# Patient Record
Sex: Male | Born: 1955 | Race: White | Hispanic: No | Marital: Married | Smoking: Former smoker
Health system: Southern US, Community
[De-identification: ages and names within clinical notes are randomized; demographics above are authoritative.]

## PROBLEM LIST (undated history)

## (undated) DIAGNOSIS — I10 Essential (primary) hypertension: Secondary | ICD-10-CM

## (undated) DIAGNOSIS — E785 Hyperlipidemia, unspecified: Secondary | ICD-10-CM

## (undated) DIAGNOSIS — I639 Cerebral infarction, unspecified: Secondary | ICD-10-CM

## (undated) DIAGNOSIS — R972 Elevated prostate specific antigen [PSA]: Secondary | ICD-10-CM

## (undated) DIAGNOSIS — Z86010 Personal history of colonic polyps: Secondary | ICD-10-CM

## (undated) DIAGNOSIS — I480 Paroxysmal atrial fibrillation: Secondary | ICD-10-CM

## (undated) DIAGNOSIS — I6529 Occlusion and stenosis of unspecified carotid artery: Secondary | ICD-10-CM

## (undated) DIAGNOSIS — G4733 Obstructive sleep apnea (adult) (pediatric): Secondary | ICD-10-CM

## (undated) HISTORY — DX: Obstructive sleep apnea (adult) (pediatric): G47.33

## (undated) HISTORY — DX: Paroxysmal atrial fibrillation: I48.0

## (undated) HISTORY — DX: Cerebral infarction, unspecified: I63.9

## (undated) HISTORY — DX: Occlusion and stenosis of unspecified carotid artery: I65.29

## (undated) HISTORY — PX: PROSTATE BIOPSY: SHX241

## (undated) HISTORY — DX: Personal history of colonic polyps: Z86.010

## (undated) HISTORY — DX: Hyperlipidemia, unspecified: E78.5

## (undated) HISTORY — PX: ACHILLES TENDON REPAIR: SUR1153

## (undated) HISTORY — DX: Essential (primary) hypertension: I10

## (undated) HISTORY — PX: TONSILLECTOMY: SUR1361

---

## 2003-10-23 HISTORY — PX: COLONOSCOPY: SHX174

## 2004-02-08 ENCOUNTER — Ambulatory Visit (HOSPITAL_COMMUNITY): Admission: RE | Admit: 2004-02-08 | Discharge: 2004-02-08 | Payer: Self-pay | Admitting: Family Medicine

## 2004-04-14 ENCOUNTER — Ambulatory Visit (HOSPITAL_COMMUNITY): Admission: RE | Admit: 2004-04-14 | Discharge: 2004-04-14 | Payer: Self-pay | Admitting: Internal Medicine

## 2006-03-14 ENCOUNTER — Ambulatory Visit (HOSPITAL_COMMUNITY): Admission: RE | Admit: 2006-03-14 | Discharge: 2006-03-14 | Payer: Self-pay | Admitting: Family Medicine

## 2007-05-05 ENCOUNTER — Ambulatory Visit (HOSPITAL_COMMUNITY): Admission: RE | Admit: 2007-05-05 | Discharge: 2007-05-05 | Payer: Self-pay | Admitting: Family Medicine

## 2007-07-11 ENCOUNTER — Ambulatory Visit (HOSPITAL_COMMUNITY): Admission: RE | Admit: 2007-07-11 | Discharge: 2007-07-11 | Payer: Self-pay | Admitting: Family Medicine

## 2009-07-22 DIAGNOSIS — I639 Cerebral infarction, unspecified: Secondary | ICD-10-CM

## 2009-07-22 HISTORY — DX: Cerebral infarction, unspecified: I63.9

## 2009-08-02 ENCOUNTER — Inpatient Hospital Stay (HOSPITAL_COMMUNITY): Admission: EM | Admit: 2009-08-02 | Discharge: 2009-08-05 | Payer: Self-pay | Admitting: Emergency Medicine

## 2009-08-02 ENCOUNTER — Ambulatory Visit: Payer: Self-pay | Admitting: Cardiology

## 2009-08-02 DIAGNOSIS — I639 Cerebral infarction, unspecified: Secondary | ICD-10-CM

## 2009-08-02 HISTORY — DX: Cerebral infarction, unspecified: I63.9

## 2009-08-03 ENCOUNTER — Encounter (INDEPENDENT_AMBULATORY_CARE_PROVIDER_SITE_OTHER): Payer: Self-pay | Admitting: Internal Medicine

## 2009-08-03 DIAGNOSIS — I6529 Occlusion and stenosis of unspecified carotid artery: Secondary | ICD-10-CM

## 2009-08-03 HISTORY — DX: Occlusion and stenosis of unspecified carotid artery: I65.29

## 2009-09-01 DIAGNOSIS — G4733 Obstructive sleep apnea (adult) (pediatric): Secondary | ICD-10-CM

## 2009-09-01 HISTORY — DX: Obstructive sleep apnea (adult) (pediatric): G47.33

## 2011-01-25 LAB — GLUCOSE, CAPILLARY
Glucose-Capillary: 103 mg/dL — ABNORMAL HIGH (ref 70–99)
Glucose-Capillary: 90 mg/dL (ref 70–99)
Glucose-Capillary: 96 mg/dL (ref 70–99)

## 2011-01-25 LAB — PROTIME-INR
INR: 1 (ref 0.00–1.49)
INR: 1.09 (ref 0.00–1.49)
Prothrombin Time: 13.1 seconds (ref 11.6–15.2)
Prothrombin Time: 13.5 seconds (ref 11.6–15.2)
Prothrombin Time: 14 seconds (ref 11.6–15.2)

## 2011-01-25 LAB — CK TOTAL AND CKMB (NOT AT ARMC)
CK, MB: 2 ng/mL (ref 0.3–4.0)
Relative Index: 1.1 (ref 0.0–2.5)
Total CK: 190 U/L (ref 7–232)

## 2011-01-25 LAB — BASIC METABOLIC PANEL
Calcium: 9 mg/dL (ref 8.4–10.5)
Calcium: 9.3 mg/dL (ref 8.4–10.5)
GFR calc Af Amer: >60 mL/min (ref >60)
GFR calc Af Amer: >60 mL/min (ref >60)
GFR calc non Af Amer: >60 mL/min (ref >60)
GFR calc non Af Amer: >60 mL/min (ref >60)
Glucose, Bld: 95 mg/dL (ref 70–99)
Potassium: 3.8 mEq/L (ref 3.5–5.1)
Potassium: 4.3 mEq/L (ref 3.5–5.1)
Sodium: 139 mEq/L (ref 135–145)
Sodium: 140 mEq/L (ref 135–145)

## 2011-01-25 LAB — CBC
HCT: 49.7 % (ref 39.0–52.0)
MCV: 88.7 fL (ref 78.0–100.0)
Platelets: 192 10*3/uL (ref 150–400)
Platelets: 210 10*3/uL (ref 150–400)
RBC: 5.18 MIL/uL (ref 4.22–5.81)
RBC: 5.6 MIL/uL (ref 4.22–5.81)
RDW: 12.7 % (ref 11.5–15.5)
WBC: 7.8 10*3/uL (ref 4.0–10.5)

## 2011-01-25 LAB — LUPUS ANTICOAGULANT PANEL
DRVVT: 41.9 secs — ABNORMAL HIGH (ref 34.7–40.5)
dRVVT Incubated 1:1 Mix: 38 secs (ref 36.1–47.0)

## 2011-01-25 LAB — POCT I-STAT, CHEM 8
Creatinine, Ser: 0.7 mg/dL (ref 0.4–1.5)
HCT: 47 % (ref 39.0–52.0)
TCO2: 21 mmol/L (ref 0–100)

## 2011-01-25 LAB — BETA-2-GLYCOPROTEIN I ABS, IGG/M/A
Beta-2 Glyco I IgG: <3 U/mL (ref <=15)
Beta-2-Glycoprotein I IgA: <3 U/mL (ref <=15)
Beta-2-Glycoprotein I IgM: 4 U/mL (ref <=15)

## 2011-01-25 LAB — DIFFERENTIAL
Basophils Relative: 1 % (ref 0–1)
Eosinophils Absolute: 0.1 10*3/uL (ref 0.0–0.7)
Monocytes Absolute: 0.3 10*3/uL (ref 0.1–1.0)
Monocytes Relative: 8 % (ref 3–12)
Neutrophils Relative %: 61 % (ref 43–77)

## 2011-01-25 LAB — COMPREHENSIVE METABOLIC PANEL
ALT: 26 U/L (ref 0–53)
AST: 26 U/L (ref 0–37)
Albumin: 4.1 g/dL (ref 3.5–5.2)
CO2: 26 mEq/L (ref 19–32)
Calcium: 9.1 mg/dL (ref 8.4–10.5)
Chloride: 108 mEq/L (ref 96–112)
Creatinine, Ser: 0.87 mg/dL (ref 0.4–1.5)
GFR calc Af Amer: >60 mL/min (ref >60)
GFR calc non Af Amer: >60 mL/min (ref >60)
Sodium: 139 mEq/L (ref 135–145)

## 2011-01-25 LAB — TSH: TSH: 1.354 u[IU]/mL (ref 0.350–4.500)

## 2011-01-25 LAB — HEMOGLOBIN A1C: Mean Plasma Glucose: 108 mg/dL

## 2011-01-25 LAB — LIPID PANEL
HDL: 50 mg/dL (ref >39)
Total CHOL/HDL Ratio: 3 RATIO
Triglycerides: 119 mg/dL (ref <150)
VLDL: 24 mg/dL (ref 0–40)

## 2011-01-25 LAB — TROPONIN I: Troponin I: <0.01 ng/mL (ref 0.00–0.06)

## 2011-01-25 LAB — PROTEIN S, TOTAL: Protein S Ag, Total: 95 % (ref 70–140)

## 2011-01-25 LAB — APTT: aPTT: 24 seconds (ref 24–37)

## 2011-01-25 LAB — PROTHROMBIN GENE MUTATION

## 2011-01-25 LAB — CARDIOLIPIN ANTIBODIES, IGG, IGM, IGA: Anticardiolipin IgM: <3 MPL U/mL — ABNORMAL LOW (ref <10)

## 2011-01-25 LAB — SEDIMENTATION RATE: Sed Rate: 2 mm/hr (ref 0–16)

## 2011-01-25 LAB — PROTEIN C, TOTAL: Protein C, Total: 105 % (ref 70–140)

## 2011-01-25 LAB — COMPLEMENT, TOTAL: Compl, Total (CH50): 32 U/mL (ref 31–60)

## 2011-01-25 LAB — C3 COMPLEMENT: C3 Complement: 113 mg/dL (ref 88–201)

## 2011-01-25 LAB — RPR: RPR Ser Ql: NONREACTIVE

## 2011-03-09 NOTE — Consult Note (Signed)
NAME:  Aaron Morales, FREUND NO.:  [id number]   MEDICAL RECORD NO.:  [id number]                  PATIENT TYPE:   LOCATION:                                       FACILITY:   PHYSICIAN:  R. Roetta Sessions, M.D.              DATE OF BIRTH:  Nov 28, 1955   DATE OF CONSULTATION:  03/29/2004  DATE OF DISCHARGE:                                   CONSULTATION   PRIMARY CARE PHYSICIAN:  Kirk Ruths, M.D.   REASON FOR CONSULTATION:  Hemoccult-positive stool.   HISTORY OF PRESENT ILLNESS:  Mr. Aaron Morales is a pleasant 55 year old  gentleman who was sent over by the courtesy of Dr. Yetta Numbers to further  evaluate Hemoccult-positive stool found on returnable cards.  Mr. Caggiano  really has not had any bowel symptoms.  He denies rectal bleeding, melena,  constipation, diarrhea.  No abdominal pain.  No upper GI tract symptoms such  as odynophagia, dysphagia, early satiety, reflux symptoms, nausea, or  vomiting.  He presented for a recent routine physical examination and was  found to be Hemoccult-positive, as described above.  No family history of  colorectal neoplasia.  He has never had his colon imaged previously.  He  takes an aspirin 81 mg daily which he recently started a few weeks ago.   LABORATORY DATA:  Laboratory evaluation at Dr. Edison Simon office included a  CBC and CHEM-20, both of which were completely normal.  Lipid profile also  looked good, with cholesterol 173, HDL 53, LDL 94.  TSH and PSA were normal  at 1.868 and 0.64, respectively.   PAST MEDICAL HISTORY:  Significant for no chronic illnesses.   PAST SURGICAL HISTORY:  1. Tonsillectomy.  2. Left Achilles tendon repair previously.   FAMILY HISTORY:  Mother and father are in good health.  No history of  chronic GI or liver illness.   SOCIAL HISTORY:  The patient has been married for 15 years and has two  daughters.  He is employed with [employer] in [place].  No  tobacco.  He drinks three to four beers weekly.   REVIEW OF SYSTEMS:  As in the history of present illness.  No chest pain,  dyspnea on exertion, no fever or chills.  No change in weight.   PHYSICAL EXAMINATION:  GENERAL:  Examination reveals a pleasant 55 year old  gentleman resting comfortably.  VITAL SIGNS:  Weight 254, height 5 foot 9 inches.  Temperature 98, blood  pressure 120/82, pulse 64.  SKIN:  Warm and dry.  No jaundice.  No continuous stigmata of chronic liver  disease.  HEENT:  Conjunctivae pink.  CHEST:  Lungs are clear to auscultation.  CARDIAC:  Regular rate and rhythm without murmurs, gallops, or rubs.  ABDOMEN:  Nondistended, positive bowel sounds, soft, nontender, without  appreciable mass or organomegaly.  Rectal exam is deferred to the time of  colonoscopy.   IMPRESSION:  Mr. Aaron Morales is a 55 year old gentleman essentially devoid  of any gastrointestinal tract symptoms.  He was found to be Hemoccult-  positive.  He needs to have a colonoscopy.   To this end, I have offered Mr. Busbee a colonoscopy in the very near  future at [facility].  We discussed the potential risks, benefits,  and alternatives.  His questions were answered.  He is agreeable.  I will  make further recommendations once the colonoscopy has been performed.   I would like to thank Dr. Yetta Numbers for referring this nice gentleman to  me today.      ___________________________________________                                            Jonathon Bellows, M.D.   RMR/MEDQ  D:  03/29/2004  T:  03/30/2004  Job:  [id number]   cc:   Kirk Ruths, M.D.  [address]  [place]  [place] [address]  Fax: [phone]

## 2011-03-09 NOTE — Op Note (Signed)
NAME:  Aaron Morales, Aaron Morales NO.:  [id number]   MEDICAL RECORD NO.:  [id number]                   PATIENT TYPE:  AMB   LOCATION:  DAY                                  FACILITY:  [facility]   PHYSICIAN:  R. Roetta Sessions, M.D.              DATE OF BIRTH:  1956-01-24   DATE OF PROCEDURE:  DATE OF DISCHARGE:                                 OPERATIVE REPORT   PROCEDURE:  Diagnostic colonoscopy.   ENDOSCOPIST:  Gerrit Friends. Rourk, M.D.   INDICATIONS FOR PROCEDURE:  The patient is a 55 year old gentleman who was  felt to be Hemoccult positive recently in Dr. Edison Simon office.  He has not  had any hematochezia, melena, abdominal pain, or upper GI tract symptoms.  No family history of colorectal neoplasia.  He does take an aspirin daily.  Colonoscopy is now being down.  This approach has been discussed with the  patient at length. The potential risks, benefits, and alternatives have been  reviewed; questions answered.  He is agreeable.  Please see my dictated H&P.   PROCEDURE NOTE:  O2 saturation, blood pressure, pulse and respirations were  monitored throughout the entire procedure.  Conscious sedation: Versed 6 mg IV, Demerol 100 mg IV in divided doses.   INSTRUMENT:  Olympus video chip system.   FINDINGS:  Digital rectal exam revealed no abnormalities.   ENDOSCOPIC FINDINGS:  The prep was excellent.   RECTUM:  Examination of the rectal mucosa including the retroflex view of  the anal verge revealed no abnormalities.   COLON:  The colonic mucosa was surveyed from the rectosigmoid junction  through the left transverse and right colon to the area of the appendiceal  orifice, ileocecal valve, and cecum.  These structures were well seen and  photographed for the record.   From this level the scope was slowly withdrawn; all previously mentioned  mucosal surfaces were again seen.  The colonic mucosa appeared entirely  normal. The patient tolerated the procedure well and  was reacted in  endoscopy.   IMPRESSION:  1. Normal rectum.  2. Normal colon.   RECOMMENDATIONS:  1. Repeat colonoscopy in 10 years.  2. If [name] were to develop clinical signs and symptoms of GI     bleeding otherwise, then further evaluation would be warranted.      ___________________________________________                                            Jonathon Bellows, M.D.   RMR/MEDQ  D:  04/14/2004  T:  04/14/2004  Job:  [id number]   cc:   Kirk Ruths, M.D.  [address]  [place]  [place] [address]  Fax: [phone]   R. Roetta Sessions, M.D.  [address]  [place]  [place] [address]  Fax:  [phone] 

## 2013-01-15 ENCOUNTER — Encounter: Payer: Self-pay | Admitting: *Deleted

## 2013-05-21 ENCOUNTER — Other Ambulatory Visit: Payer: Self-pay | Admitting: *Deleted

## 2013-07-28 ENCOUNTER — Other Ambulatory Visit: Payer: Self-pay

## 2013-07-28 MED ORDER — PROPAFENONE HCL 150 MG PO TABS: 150.0000 mg | ORAL_TABLET | Freq: Three times a day (TID) | ORAL | Status: DC

## 2013-07-28 NOTE — Telephone Encounter (Signed)
Rx was sent to pharmacy electronically. 

## 2014-12-30 ENCOUNTER — Encounter: Payer: Self-pay | Admitting: Internal Medicine

## 2015-02-18 ENCOUNTER — Encounter: Payer: Self-pay | Admitting: Internal Medicine

## 2015-02-18 ENCOUNTER — Ambulatory Visit (INDEPENDENT_AMBULATORY_CARE_PROVIDER_SITE_OTHER): Payer: 59 | Admitting: Internal Medicine

## 2015-02-18 VITALS — BP 104/72 | HR 84 | Ht 66.75 in | Wt 204.4 lb

## 2015-02-18 DIAGNOSIS — Z7901 Long term (current) use of anticoagulants: Secondary | ICD-10-CM

## 2015-02-18 DIAGNOSIS — Z1211 Encounter for screening for malignant neoplasm of colon: Secondary | ICD-10-CM

## 2015-02-18 DIAGNOSIS — Z8673 Personal history of transient ischemic attack (TIA), and cerebral infarction without residual deficits: Secondary | ICD-10-CM

## 2015-02-18 DIAGNOSIS — I482 Chronic atrial fibrillation, unspecified: Secondary | ICD-10-CM

## 2015-02-18 NOTE — Patient Instructions (Addendum)
We are putting you in for a colon recall notification for July 2016.   Please call your PCP and get an appointment so that they can make a referral for you to see Dr. Dani Gobble Croitoru's office and get an appointment to get re-established there ( #  [phone].)   I appreciate the opportunity to care for you.

## 2015-02-18 NOTE — Progress Notes (Signed)
Referred by Delman Cheadle, PA-C Subjective:    Patient ID: Aaron Morales, male    DOB: [birth year], 59 y.o.   MRN: [id number] Cc: colon cancer screening HPI The patient is here to discuss colon cancer screening by colonoscopy at request of PCP> He has a hx of colonoscopy in past when he was in his 46's , 8-10 yrs ago (Dr. Gala Romney) performed to evaluate heme + stool. It was negative per patient. He  Takes warfarin for atrial fibrillation. He has a hx of embolic stroke. He cannot tell when he is in/out of A fib. He has had some discussions w/ PCP about changing from warfarin to a newer anti-coagulant. He has not seen cardiology in years as first cardiologist moved and then [place] office of [facility] cardiology closed and he "could not find Dr. Olevia Perches".   No Known Allergies Outpatient Prescriptions Prior to Visit  Medication Sig Dispense Refill  . metoprolol succinate (TOPROL-XL) 50 MG 24 hr tablet Take 50 mg by mouth daily. Take with or immediately following a meal.    . warfarin (COUMADIN) 6 MG tablet Take 6.5-7 mg by mouth daily. As per INR by Dr Orson Ape    . propafenone (RYTHMOL) 150 MG tablet Take 1 tablet (150 mg total) by mouth every 8 (eight) hours. 90 tablet 0   No facility-administered medications prior to visit.   Past Medical History  Diagnosis Date  . Stroke 07/2009    left frontal lobe stroke  . Paroxysmal atrial fibrillation   . Hypertension   . CVA (cerebral vascular accident) 08/02/2009    acute 2D Echo performed show no evidence of an internall cardiac thrombosis, atrial septal defect, or a significant aortic and mitral valve disease  . Carotid artery stenosis 08/03/2009    internal carotid artery stenosis on the right and no significant disease on the left as well as bilateral antegrade vertebral artery flow,  . OSA (obstructive sleep apnea) 09/01/09    sleep study performed did not reveal evidence of sleep apnea, REM was at 2.8hr, AHI  (5h39min) was 0.5/hr,     . Dyslipidemia    Past Surgical History  Procedure Laterality Date  . Colonoscopy  2005    GI bleeding  . Tonsillectomy    . Achilles tendon repair Left    History   Social History  . Marital Status: Married    Spouse Name: N/A  . Number of Children: 2  . Years of Education: N/A   Occupational History  . financial advisor    Social History Main Topics  . Smoking status: Former Smoker    Types: Cigarettes    Quit date: 01/16/1987  . Smokeless tobacco: Never Used  . Alcohol Use: 0.0 oz/week    0 Standard drinks or equivalent per week     Comment: 2-3 per day  . Drug Use: No  . Sexual Activity: Not on file   Other Topics Concern  . None   Social History Narrative   Married , lives with wife. He has 2 daughters.   Financial advisor   2 caffeinated beverages daily.   02/21/2015      Family History  Problem Relation Age of Onset  . Transient ischemic attack Mother   . Aortic aneurysm Paternal Aunt   . CVA Maternal Grandfather     also Aortic Aneurysm  . Lung cancer Father     small cell       Review of Systems All other review of systems are negative  or accept as per history of present illness    Objective:   Physical Exam @BP  104/72 mmHg  Pulse 84  Ht 5' 6.75" (1.695 m)  Wt 204 lb 6 oz (92.704 kg)  BMI 32.27 kg/m2@  General:  NAD Eyes:   anicteric Lungs:  clear Heart:: Afib - irregular rhythm S1S2 no rubs, murmurs or gallops Psych:  Normal mood and affect   Data Reviewed:  PCP notes, labs from 2016      Assessment & Plan:  Chronic atrial fibrillation  Warfarin anticoagulation  History of stroke  Colon cancer screening   i reviewed all available options for CRCA screening (iFOBT, Cologuard, CT colonoscopy, optical colonoscopy) Optical colonoscopy seems to be his preferred method but he wants to minimize stroke risk off warfarin (so do I) so will arrange Cardiology f/u w/ Dr. Olevia Perches. ? Change to Xa inhibitor Would need Lovenox window  w/ warfarin for colonoscopy given prior stroke I bet - want cardiology input. He will have PCP refer. He will then schedule f/u w/ me. Recall for 04/2015 in case have not heard back re: anti-coagulation    Cc: Delman Cheadle, PA-C and Sanda Klein, MD

## 2015-02-21 ENCOUNTER — Encounter: Payer: Self-pay | Admitting: Internal Medicine

## 2015-02-23 ENCOUNTER — Telehealth: Payer: Self-pay | Admitting: Cardiovascular Disease

## 2015-02-23 NOTE — Telephone Encounter (Signed)
Closed encounter °

## 2015-03-09 ENCOUNTER — Telehealth: Payer: Self-pay | Admitting: Cardiovascular Disease

## 2015-03-09 NOTE — Telephone Encounter (Signed)
Left message for patient to call regarding his appointment 03/11/15.  Per Lucy Chris in the patient accounting department at [facility] [address] PCP will not issue referral because patient has not been seen.  Patient will need to sign financial waiver or reschedule his appointment with our office.

## 2015-03-11 ENCOUNTER — Encounter: Payer: Self-pay | Admitting: Cardiovascular Disease

## 2015-03-11 ENCOUNTER — Ambulatory Visit: Payer: 59 | Admitting: Cardiovascular Disease

## 2015-03-11 ENCOUNTER — Ambulatory Visit (INDEPENDENT_AMBULATORY_CARE_PROVIDER_SITE_OTHER): Payer: 59 | Admitting: Cardiovascular Disease

## 2015-03-11 VITALS — BP 126/90 | HR 89 | Ht 66.5 in | Wt 206.7 lb

## 2015-03-11 DIAGNOSIS — I4811 Longstanding persistent atrial fibrillation: Secondary | ICD-10-CM

## 2015-03-11 DIAGNOSIS — Z8673 Personal history of transient ischemic attack (TIA), and cerebral infarction without residual deficits: Secondary | ICD-10-CM

## 2015-03-11 DIAGNOSIS — I481 Persistent atrial fibrillation: Secondary | ICD-10-CM

## 2015-03-11 DIAGNOSIS — I4819 Other persistent atrial fibrillation: Secondary | ICD-10-CM

## 2015-03-11 MED ORDER — DABIGATRAN ETEXILATE MESYLATE 150 MG PO CAPS: 150.0000 mg | ORAL_CAPSULE | Freq: Two times a day (BID) | ORAL | Status: DC

## 2015-03-11 NOTE — Patient Instructions (Addendum)
START Pradaxa 150mg  twice daily.  STOP Warfarin today.  Dr. Sallyanne Kuster recommends that you schedule a follow-up appointment in: 3-4 months.

## 2015-03-12 DIAGNOSIS — Z8673 Personal history of transient ischemic attack (TIA), and cerebral infarction without residual deficits: Secondary | ICD-10-CM | POA: Insufficient documentation

## 2015-03-12 DIAGNOSIS — I4811 Longstanding persistent atrial fibrillation: Secondary | ICD-10-CM | POA: Insufficient documentation

## 2015-03-12 NOTE — Progress Notes (Signed)
Patient ID: Aaron Morales, male   DOB: May 31, 1956, 59 y.o.   MRN: [id number]     Cardiology Office Note   Date:  03/12/2015   ID:  Aaron Morales, DOB [birth year], MRN [id number]  PCP:  Purvis Kilts, MD  Cardiologist:   Sanda Klein, MD   Chief Complaint  Patient presents with  . New Evaluation    To get re-established for clearance for a colonoscopy by Dr. Silvano Rusk.  No complaints of chest pain, SOB, edema or dizziness.      History of Present Illness: Aaron Morales is a 59 y.o. male who presents for reinitiation of cardiology follow-up. In 2010 he had a left frontal stroke, manifesting as transient expressive aphasia. He was found to have paroxysmal atrial fibrillation on an event monitor. He was cared for at that time by Dr. Felton Clinton. He has been on warfarin anticoagulation ever since. The combination of metoprolol and propafenone was prescribed for rhythm control. He was always wary of the potential risks of the antiarrhythmic and in general wants to keep his medications as simple as possible. Was lost to follow-up in May 2013 and decided to discontinue his propafenone. He had no change in his symptoms and continued to feel quite well. He believes that he is now in atrial fibrillation all the time.  He does not have a history of hypertension, diabetes mellitus, congestive heart failure or any valvular disease by echocardiography performed in 2010. At that time left ventricular ejection fraction was estimated at 55%. The left atrium was described as mildly dilated(diastolic dysfunction was not commented upon). He also had a nuclear stress test in 2010 that was normal. He has never had coronary angiography and has never complained of angina pectoris. A sleep study performed in 2010 did not show any evidence of sleep apnea. Reportedly he had mild to moderate right internal carotid artery stenosis by duplex ultrasonography in 2010.  He feels well and has no cardiac complaints  whatsoever. He specifically denies palpitations, dizziness, syncope, exertional dyspnea or chest discomfort.  He is here today since he is planning to undergo colonoscopy with Dr. Carlean Purl. About 10 years ago he had a colonoscopy for occult blood in the stool found to be normal. He is also interested in switching from warfarin to a direct oral anticoagulants, but has been reluctant since he wants taken an agent that has a proven antidote.  Past Medical History  Diagnosis Date  . Stroke 07/2009    left frontal lobe stroke  . Paroxysmal atrial fibrillation   . Hypertension   . CVA (cerebral vascular accident) 08/02/2009    acute 2D Echo performed show no evidence of an internall cardiac thrombosis, atrial septal defect, or a significant aortic and mitral valve disease  . Carotid artery stenosis 08/03/2009    internal carotid artery stenosis on the right and no significant disease on the left as well as bilateral antegrade vertebral artery flow,  . OSA (obstructive sleep apnea) 09/01/09    sleep study performed did not reveal evidence of sleep apnea, REM was at 2.8hr, AHI  (5h51min) was 0.5/hr,   . Dyslipidemia     Past Surgical History  Procedure Laterality Date  . Colonoscopy  2005    GI bleeding  . Tonsillectomy    . Achilles tendon repair Left      Current Outpatient Prescriptions  Medication Sig Dispense Refill  . metoprolol succinate (TOPROL-XL) 50 MG 24 hr tablet Take 50 mg by mouth daily. Take  with or immediately following a meal.    . dabigatran (PRADAXA) 150 MG CAPS capsule Take 1 capsule (150 mg total) by mouth 2 (two) times daily. 60 capsule 6   No current facility-administered medications for this visit.    Allergies:   Review of patient's allergies indicates no known allergies.    Social History:  The patient  reports that he quit smoking about 28 years ago. His smoking use included Cigarettes. He has never used smokeless tobacco. He reports that he drinks alcohol. He  reports that he does not use illicit drugs.   Family History:  The patient's family history includes Aortic aneurysm in his paternal aunt; CVA in his maternal grandfather; Lung cancer in his father; Transient ischemic attack in his mother.    ROS:  Please see the history of present illness.    Otherwise, review of systems positive for none.   All other systems are reviewed and negative.    PHYSICAL EXAM: VS:  BP 126/90 mmHg  Pulse 89  Ht 5' 6.5" (1.689 m)  Wt 93.759 kg (206 lb 11.2 oz)  BMI 32.87 kg/m2 , BMI Body mass index is 32.87 kg/(m^2).  General: Alert, oriented x3, no distress Head: no evidence of trauma, PERRL, EOMI, no exophtalmos or lid lag, no myxedema, no xanthelasma; normal ears, nose and oropharynx Neck: normal jugular venous pulsations and no hepatojugular reflux; brisk carotid pulses without delay and no carotid bruits Chest: clear to auscultation, no signs of consolidation by percussion or palpation, normal fremitus, symmetrical and full respiratory excursions Cardiovascular: normal position and quality of the apical impulse, irregular rhythm, normal first and second heart sounds, no murmurs, rubs or gallops Abdomen: no tenderness or distention, no masses by palpation, no abnormal pulsatility or arterial bruits, normal bowel sounds, no hepatosplenomegaly Extremities: no clubbing, cyanosis or edema; 2+ radial, ulnar and brachial pulses bilaterally; 2+ right femoral, posterior tibial and dorsalis pedis pulses; 2+ left femoral, posterior tibial and dorsalis pedis pulses; no subclavian or femoral bruits Neurological: grossly nonfocal Psych: euthymic mood, full affect   EKG:  EKG is ordered today. The ekg ordered today demonstrates atrial fibrillation, otherwise normal   Recent Labs: No results found for requested labs within last 365 days.    Lipid Panel    Component Value Date/Time   CHOL  08/03/2009 0500    150        ATP III CLASSIFICATION:  <200     mg/dL    Desirable  200-239  mg/dL   Borderline High  >=240    mg/dL   High          TRIG 119 08/03/2009 0500   HDL 50 08/03/2009 0500   CHOLHDL 3.0 08/03/2009 0500   VLDL 24 08/03/2009 0500   LDLCALC  08/03/2009 0500    76        Total Cholesterol/HDL:CHD Risk Coronary Heart Disease Risk Table                     Men   Women  1/2 Average Risk   3.4   3.3  Average Risk       5.0   4.4  2 X Average Risk   9.6   7.1  3 X Average Risk  23.4   11.0        Use the calculated Patient Ratio above and the CHD Risk Table to determine the patient's CHD Risk.        ATP III CLASSIFICATION (  LDL):  <100     mg/dL   Optimal  100-129  mg/dL   Near or Above                    Optimal  130-159  mg/dL   Borderline  160-189  mg/dL   High  >190     mg/dL   Very High      Wt Readings from Last 3 Encounters:  03/11/15 93.759 kg (206 lb 11.2 oz)  02/18/15 92.704 kg (204 lb 6 oz)    ASSESSMENT AND PLAN:  Despite the absence of other risk factors are of any structural heart disease, Mr. Rieman has had a stroke related to paroxysmal atrial fibrillation and should remain on lifelong anticoagulation. I think the preferred agent in his case would be Pradaxa which would offer him the comfort of knowing that there is an available antidote in case of bleeding. We discussed the improved prevention of ischemic stroke and low risk of hemorrhagic stroke that Pradaxa also offers over warfarin. While Pradaxa may cause more GI bleeding, the increased risk is relatively small. He understands that there are other agents available with lower GI bleeding risk, but prefers the agent with an available antidote.  I think this is the optimum time to make the transition, since a direct oral anticoagulants would make it much easier to manage anticoagulation around the time of his colonoscopy. He is instructed to stop Pradaxa 24 hours before the planned colonoscopy. He is to restart it the same day of the procedure if there is no  need for biopsies or polypectomies. If a large polyp is removed, we'll have to ask the advice of Dr. Carlean Purl as to when it would be safe to resume anticoagulation.  It seems that he has transitioned from paroxysmal atrial fibrillation at least to long-term persistent atrial fibrillation if not permanent arrhythmia. He is however completely asymptomatic and has good ventricular rate control. He has no desire to take antiarrhythmics or undergo cardioversion or ablation.  Current medicines are reviewed at length with the patient today.  The patient has concerns regarding medicines. He would like to discontinue warfarin, but wishes to take an anticoagulant that has been available antidote.  The following changes have been made:  Stop warfarin, start Pradaxa 150 mg twice daily  Labs/ tests ordered today include:  Orders Placed This Encounter  Procedures  . EKG 12-Lead    Patient Instructions  START Pradaxa 150mg  twice daily.  STOP Warfarin today.  Dr. Sallyanne Kuster recommends that you schedule a follow-up appointment in: 3-4 months.       Mikael Spray, MD  03/12/2015 6:02 PM    Sanda Klein, MD, [facility] [phone] office [phone] pager

## 2015-03-25 NOTE — Progress Notes (Signed)
OK for colonoscopy previsit and to hold Pradaxa as written by cardiology in note

## 2015-03-29 ENCOUNTER — Telehealth: Payer: Self-pay

## 2015-03-29 NOTE — Telephone Encounter (Signed)
Aaron Mayer, MD at 03/25/2015 6:36 PM     Status: Signed       Expand All Collapse All   OK for colonoscopy previsit and to hold Pradaxa as written by cardiology in note           I spoke with the patient and attempted to schedule a colonoscopy.  He really wants a morning.  He has a conflict with the morning appt I have between now and 06/13/15.  He will call back in late June to schedule a colonoscopy for a morning appt when the next schedule opens.

## 2015-04-07 ENCOUNTER — Telehealth: Payer: Self-pay | Admitting: Cardiovascular Disease

## 2015-04-07 NOTE — Telephone Encounter (Signed)
Dizzy intermittently, this began w/ sharp pain in neck and head since yesterday. No other symptoms, denies weakness, nausea, etc. No blurred/occluded vision. Pt denies history of migraines.   Advised to contact PCP. Advised ED if problems unimproved or new symptoms.   Patient verbalized understanding.

## 2015-04-07 NOTE — Telephone Encounter (Signed)
Pt woke up with dizzy spell and a real sharp pain in the back of his head.

## 2015-05-11 ENCOUNTER — Encounter: Payer: Self-pay | Admitting: Internal Medicine

## 2015-06-06 ENCOUNTER — Ambulatory Visit (INDEPENDENT_AMBULATORY_CARE_PROVIDER_SITE_OTHER): Payer: 59 | Admitting: Cardiovascular Disease

## 2015-06-06 ENCOUNTER — Encounter: Payer: Self-pay | Admitting: Cardiovascular Disease

## 2015-06-06 VITALS — BP 116/82 | HR 84 | Resp 16 | Ht 66.0 in | Wt 206.4 lb

## 2015-06-06 DIAGNOSIS — Z8673 Personal history of transient ischemic attack (TIA), and cerebral infarction without residual deficits: Secondary | ICD-10-CM | POA: Diagnosis not present

## 2015-06-06 DIAGNOSIS — I481 Persistent atrial fibrillation: Secondary | ICD-10-CM

## 2015-06-06 DIAGNOSIS — I4811 Longstanding persistent atrial fibrillation: Secondary | ICD-10-CM

## 2015-06-06 NOTE — Patient Instructions (Signed)
Dr. Croitoru recommends that you schedule a follow-up appointment in: ONE YEAR   

## 2015-06-06 NOTE — Progress Notes (Signed)
Patient ID: Aaron Morales, male   DOB: August 29, 1956, 59 y.o.   MRN: [id number]     Cardiology Office Note   Date:  06/07/2015   ID:  Aaron Morales, DOB 12-18-1955, MRN [id number]  PCP:  Purvis Kilts, MD  Cardiologist:   Sanda Klein, MD   Chief Complaint  Patient presents with  . Follow-up    No problems      History of Present Illness: Aaron Morales is a 59 y.o. male who presents for follow-up of persistent atrial fibrillation with previous history of cardioembolic stroke. He has transients inform warfarin anticoagulation to Pradaxa and has not had any bleeding problems. His colonoscopy had to be rescheduled. He is unaware of any palpitations and remains in atrial fibrillation today as he was at his last appointment. It is possible and likely that this is now a permanent arrhythmia. He has not had any new focal neurological events. Rate control remains excellent. He has no cardiac symptoms.    Past Medical History  Diagnosis Date  . Stroke 07/2009    left frontal lobe stroke  . Paroxysmal atrial fibrillation   . Hypertension   . CVA (cerebral vascular accident) 08/02/2009    acute 2D Echo performed show no evidence of an internall cardiac thrombosis, atrial septal defect, or a significant aortic and mitral valve disease  . Carotid artery stenosis 08/03/2009    internal carotid artery stenosis on the right and no significant disease on the left as well as bilateral antegrade vertebral artery flow,  . OSA (obstructive sleep apnea) 09/01/09    sleep study performed did not reveal evidence of sleep apnea, REM was at 2.8hr, AHI  (5h80min) was 0.5/hr,   . Dyslipidemia     Past Surgical History  Procedure Laterality Date  . Colonoscopy  2005    GI bleeding  . Tonsillectomy    . Achilles tendon repair Left      Current Outpatient Prescriptions  Medication Sig Dispense Refill  . dabigatran (PRADAXA) 150 MG CAPS capsule Take 1 capsule (150 mg total) by mouth 2  (two) times daily. 60 capsule 6  . metoprolol succinate (TOPROL-XL) 50 MG 24 hr tablet Take 50 mg by mouth daily. Take with or immediately following a meal.     No current facility-administered medications for this visit.    Allergies:   Review of patient's allergies indicates no known allergies.    Social History:  The patient  reports that he quit smoking about 28 years ago. His smoking use included Cigarettes. He has never used smokeless tobacco. He reports that he drinks alcohol. He reports that he does not use illicit drugs.   Family History:  The patient's family history includes Aortic aneurysm in his paternal aunt; CVA in his maternal grandfather; Lung cancer in his father; Transient ischemic attack in his mother.    ROS:  Please see the history of present illness.    Otherwise, review of systems positive for none.   All other systems are reviewed and negative.    PHYSICAL EXAM: VS:  BP 116/82 mmHg  Pulse 84  Resp 16  Ht 5\' 6"  (1.676 m)  Wt 206 lb 6.4 oz (93.622 kg)  BMI 33.33 kg/m2 , BMI Body mass index is 33.33 kg/(m^2).  General: Alert, oriented x3, no distress Head: no evidence of trauma, PERRL, EOMI, no exophtalmos or lid lag, no myxedema, no xanthelasma; normal ears, nose and oropharynx Neck: normal jugular venous pulsations and no hepatojugular reflux; brisk  carotid pulses without delay and no carotid bruits Chest: clear to auscultation, no signs of consolidation by percussion or palpation, normal fremitus, symmetrical and full respiratory excursions Cardiovascular: normal position and quality of the apical impulse, irregular rhythm, normal first and second heart sounds, no murmurs, rubs or gallops Abdomen: no tenderness or distention, no masses by palpation, no abnormal pulsatility or arterial bruits, normal bowel sounds, no hepatosplenomegaly Extremities: no clubbing, cyanosis or edema; 2+ radial, ulnar and brachial pulses bilaterally; 2+ right femoral, posterior  tibial and dorsalis pedis pulses; 2+ left femoral, posterior tibial and dorsalis pedis pulses; no subclavian or femoral bruits Neurological: grossly nonfocal Psych: euthymic mood, full affect   EKG:  EKG is not ordered today.   Recent Labs: No results found for requested labs within last 365 days.    Lipid Panel    Component Value Date/Time   CHOL  08/03/2009 0500    150        ATP III CLASSIFICATION:  <200     mg/dL   Desirable  200-239  mg/dL   Borderline High  >=240    mg/dL   High          TRIG 119 08/03/2009 0500   HDL 50 08/03/2009 0500   CHOLHDL 3.0 08/03/2009 0500   VLDL 24 08/03/2009 0500   LDLCALC  08/03/2009 0500    76        Total Cholesterol/HDL:CHD Risk Coronary Heart Disease Risk Table                     Men   Women  1/2 Average Risk   3.4   3.3  Average Risk       5.0   4.4  2 X Average Risk   9.6   7.1  3 X Average Risk  23.4   11.0        Use the calculated Patient Ratio above and the CHD Risk Table to determine the patient's CHD Risk.        ATP III CLASSIFICATION (LDL):  <100     mg/dL   Optimal  100-129  mg/dL   Near or Above                    Optimal  130-159  mg/dL   Borderline  160-189  mg/dL   High  >190     mg/dL   Very High      Wt Readings from Last 3 Encounters:  06/06/15 206 lb 6.4 oz (93.622 kg)  03/11/15 206 lb 11.2 oz (93.759 kg)  02/18/15 204 lb 6 oz (92.704 kg)    ASSESSMENT AND PLAN: Long-standing persistent (probably permanent) atrial fibrillation with history of remote embolic stroke without residual deficits. Otherwise seems to have "lone" atrial fibrillation. CHADSVasc 2. Continue anticoagulation. Discussed a much shorter duration of action of Pradaxa in the way to manage anticoagulation around the time of small surgical procedure such as colonoscopy versus large procedures with high risk of bleeding. Also reviewed the fact that Pradaxa anticoagulative effect is almost immediately following oral administration, as  opposed to warfarin with a activity that is delayed by several days. Cardioversion and antiarrhythmic therapy does not appear to be justified in this completely asymptomatic patient with good ventricular rate control and normal left ventricular systolic function.   Current medicines are reviewed at length with the patient today.  The patient does not have concerns regarding medicines.  The following changes have been made:  no change  Labs/ tests ordered today include:  No orders of the defined types were placed in this encounter.    Patient Instructions  Dr. Sallyanne Kuster recommends that you schedule a follow-up appointment in: ONE YEAR       SignedSanda Klein, MD  06/07/2015 2:15 PM    Sanda Klein, MD, [facility] [phone] office [phone] pager

## 2015-06-07 ENCOUNTER — Encounter: Payer: Self-pay | Admitting: Cardiovascular Disease

## 2015-06-24 ENCOUNTER — Ambulatory Visit: Payer: 59

## 2015-06-24 VITALS — Ht 67.0 in | Wt 207.4 lb

## 2015-06-24 DIAGNOSIS — Z800 Family history of malignant neoplasm of digestive organs: Secondary | ICD-10-CM

## 2015-06-24 NOTE — Progress Notes (Signed)
No allergies to eggs or soy No past problems with anesthesia No diet/weight loss meds No home oxygen  emmi given

## 2015-07-06 ENCOUNTER — Ambulatory Visit (AMBULATORY_SURGERY_CENTER): Payer: 59 | Admitting: Internal Medicine

## 2015-07-06 ENCOUNTER — Encounter: Payer: Self-pay | Admitting: Internal Medicine

## 2015-07-06 VITALS — BP 123/86 | HR 79 | Temp 97.1°F | Resp 20 | Ht 66.0 in | Wt 206.0 lb

## 2015-07-06 DIAGNOSIS — Z1211 Encounter for screening for malignant neoplasm of colon: Secondary | ICD-10-CM

## 2015-07-06 DIAGNOSIS — D120 Benign neoplasm of cecum: Secondary | ICD-10-CM | POA: Diagnosis not present

## 2015-07-06 MED ORDER — SODIUM CHLORIDE 0.9 % IV SOLN: 500.0000 mL | INTRAVENOUS | Status: DC

## 2015-07-06 NOTE — Progress Notes (Signed)
Called to room to assist during endoscopic procedure.  Patient ID and intended procedure confirmed with present staff. Received instructions for my participation in the procedure from the performing physician.  

## 2015-07-06 NOTE — Progress Notes (Signed)
Transferred to recovery room. A/O x3, pleased with MAC.  VSS.  Report to Celia, RN. 

## 2015-07-06 NOTE — Patient Instructions (Addendum)
I found and removed one tiny polyp that looks benign. You also have a condition called diverticulosis - common and not usually a problem. Please read the handout provided.   I will let you know pathology results and when to have another routine colonoscopy by mail.  I appreciate the opportunity to care for you. Gatha Mayer, MD, [facility]  Discharge instructions given. Handouts on polyps and diverticulosis. Resume previous medications. Resume pradaxa today. YOU HAD AN ENDOSCOPIC PROCEDURE TODAY AT [facility]:   Refer to the procedure report that was given to you for any specific questions about what was found during the examination.  If the procedure report does not answer your questions, please call your gastroenterologist to clarify.  If you requested that your care partner not be given the details of your procedure findings, then the procedure report has been included in a sealed envelope for you to review at your convenience later.  YOU SHOULD EXPECT: Some feelings of bloating in the abdomen. Passage of more gas than usual.  Walking can help get rid of the air that was put into your GI tract during the procedure and reduce the bloating. If you had a lower endoscopy (such as a colonoscopy or flexible sigmoidoscopy) you may notice spotting of blood in your stool or on the toilet paper. If you underwent a bowel prep for your procedure, you may not have a normal bowel movement for a few days.  Please Note:  You might notice some irritation and congestion in your nose or some drainage.  This is from the oxygen used during your procedure.  There is no need for concern and it should clear up in a day or so.  SYMPTOMS TO REPORT IMMEDIATELY:   Following lower endoscopy (colonoscopy or flexible sigmoidoscopy):  Excessive amounts of blood in the stool  Significant tenderness or worsening of abdominal pains  Swelling of the abdomen that is new, acute  Fever of 100F or  higher   For urgent or emergent issues, a gastroenterologist can be reached at any hour by calling [phone].   DIET: Your first meal following the procedure should be a small meal and then it is ok to progress to your normal diet. Heavy or fried foods are harder to digest and may make you feel nauseous or bloated.  Likewise, meals heavy in dairy and vegetables can increase bloating.  Drink plenty of fluids but you should avoid alcoholic beverages for 24 hours.  ACTIVITY:  You should plan to take it easy for the rest of today and you should NOT DRIVE or use heavy machinery until tomorrow (because of the sedation medicines used during the test).    FOLLOW UP: Our staff will call the number listed on your records the next business day following your procedure to check on you and address any questions or concerns that you may have regarding the information given to you following your procedure. If we do not reach you, we will leave a message.  However, if you are feeling well and you are not experiencing any problems, there is no need to return our call.  We will assume that you have returned to your regular daily activities without incident.  If any biopsies were taken you will be contacted by phone or by letter within the next 1-3 weeks.  Please call us at [phone] if you have not heard about the biopsies in 3 weeks.    SIGNATURES/CONFIDENTIALITY: You and/or your care  partner have signed paperwork which will be entered into your electronic medical record.  These signatures attest to the fact that that the information above on your After Visit Summary has been reviewed and is understood.  Full responsibility of the confidentiality of this discharge information lies with you and/or your care-partner.

## 2015-07-06 NOTE — Op Note (Signed)
[facility]  [address] [place], [address]   COLONOSCOPY PROCEDURE REPORT  PATIENT: Aaron, Morales  MR#: [id number] BIRTHDATE: March 23, 1956 , 31  yrs. old GENDER: male ENDOSCOPIST: Gatha Mayer, MD, [facility] PROCEDURE DATE:  07/06/2015 PROCEDURE:   Colonoscopy, screening and Colonoscopy with snare polypectomy First Screening Colonoscopy - Avg.  risk and is 50 yrs.  old or older Yes.  Prior Negative Screening - Now for repeat screening. N/A  History of Adenoma - Now for follow-up colonoscopy & has been > or = to 3 yrs.  N/A  Polyps removed today? Yes ASA CLASS:   Class II INDICATIONS:Screening for colonic neoplasia and Colorectal Neoplasm Risk Assessment for this procedure is average risk. MEDICATIONS: Propofol 250 mg IV and Monitored anesthesia care  DESCRIPTION OF PROCEDURE:   After the risks benefits and alternatives of the procedure were thoroughly explained, informed consent was obtained.  The digital rectal exam revealed no abnormalities of the rectum, revealed no prostatic nodules, and revealed the prostate was not enlarged.   The LB [id number] endoscope was introduced through the anus and advanced to the cecum, which was identified by both the appendix and ileocecal valve. No adverse events experienced.   The quality of the prep was excellent.  (MiraLax was used)  The instrument was then slowly withdrawn as the colon was fully examined. Estimated blood loss is zero unless otherwise noted in this procedure report.      COLON FINDINGS: A smooth sessile polyp measuring 4 mm in size was found at the cecum.  A polypectomy was performed with a cold snare. The resection was complete, the polyp tissue was completely retrieved and sent to histology.   There was moderate diverticulosis noted in the sigmoid colon.   The examination was otherwise normal.  Retroflexed views revealed no abnormalities. The time to cecum = 4.3 Withdrawal time = 13.2   The  scope was withdrawn and the procedure completed. COMPLICATIONS: There were no immediate complications.  ENDOSCOPIC IMPRESSION: 1.   4 mm sessile polyp was found at the cecum; polypectomy was performed with a cold snare 2.   Moderate diverticulosis was noted in the sigmoid colon 3.   The examination was otherwise normal - excellent prep  RECOMMENDATIONS: Timing of repeat colonoscopy will be determined by pathology findings. restart Pradaxa and all other medication today  eSigned:  Gatha Mayer, MD, [facility] 07/06/2015 8:29 AM   cc: Sharilyn Sites, MD and The Patient   PATIENT NAME:  Aaron, Morales MR#: [id number]

## 2015-07-07 ENCOUNTER — Telehealth: Payer: Self-pay

## 2015-07-07 NOTE — Telephone Encounter (Signed)
Left message on answering machine. 

## 2015-07-19 ENCOUNTER — Encounter: Payer: Self-pay | Admitting: Internal Medicine

## 2015-07-19 DIAGNOSIS — Z860101 Personal history of adenomatous and serrated colon polyps: Secondary | ICD-10-CM | POA: Insufficient documentation

## 2015-07-19 DIAGNOSIS — Z86010 Personal history of colonic polyps: Secondary | ICD-10-CM

## 2015-07-19 HISTORY — DX: Personal history of colonic polyps: Z86.010

## 2015-07-19 HISTORY — DX: Personal history of adenomatous and serrated colon polyps: Z86.0101

## 2015-07-19 NOTE — Progress Notes (Signed)
Quick Note:  4 mm adenoma - repeat colonoscopy 7 years 2023 ______

## 2015-10-12 ENCOUNTER — Other Ambulatory Visit: Payer: Self-pay | Admitting: Cardiovascular Disease

## 2015-10-13 NOTE — Telephone Encounter (Signed)
Rx(s) sent to pharmacy electronically.  

## 2015-10-21 ENCOUNTER — Telehealth: Payer: Self-pay | Admitting: Cardiovascular Disease

## 2015-10-21 NOTE — Telephone Encounter (Signed)
°*  STAT* If patient is at the pharmacy, call can be transferred to refill team.   1. Which medications need to be refilled? (please list name of each medication and dose if known) Pradaxa 150mg    2. Which pharmacy/location (including street and city if local pharmacy) is medication to be sent to? [facility] on [address]  3. Do they need a 30 day or 90 day supply? [place]

## 2015-10-21 NOTE — Telephone Encounter (Signed)
Pt called back stating that the pharmacy received the refill request but a prior authorization is needed before it can be refilled. Please assist as quickly as possible because the pt says that his insurance will change after mid night and could possibly complicate him getting a refill.   Thanks

## 2015-10-21 NOTE — Telephone Encounter (Signed)
Prior auth for Pradaxa 150mg  sent to [facility]. Left patient a voicemail to this affect.

## 2015-10-25 ENCOUNTER — Telehealth: Payer: Self-pay

## 2015-10-25 NOTE — Telephone Encounter (Signed)
Per [facility], no prior auth necessary forPradaxa. It is on list of covered meds.

## 2016-10-26 ENCOUNTER — Encounter: Payer: Self-pay | Admitting: Cardiovascular Disease

## 2016-10-26 ENCOUNTER — Ambulatory Visit (INDEPENDENT_AMBULATORY_CARE_PROVIDER_SITE_OTHER): Payer: BLUE CROSS/BLUE SHIELD | Admitting: Cardiovascular Disease

## 2016-10-26 VITALS — BP 120/88 | HR 87 | Ht 66.0 in | Wt 211.4 lb

## 2016-10-26 DIAGNOSIS — Z8673 Personal history of transient ischemic attack (TIA), and cerebral infarction without residual deficits: Secondary | ICD-10-CM

## 2016-10-26 DIAGNOSIS — I481 Persistent atrial fibrillation: Secondary | ICD-10-CM

## 2016-10-26 DIAGNOSIS — Z7901 Long term (current) use of anticoagulants: Secondary | ICD-10-CM | POA: Diagnosis not present

## 2016-10-26 DIAGNOSIS — I4811 Longstanding persistent atrial fibrillation: Secondary | ICD-10-CM

## 2016-10-26 MED ORDER — DABIGATRAN ETEXILATE MESYLATE 150 MG PO CAPS: ORAL_CAPSULE | ORAL | 10 refills | Status: DC

## 2016-10-26 NOTE — Patient Instructions (Signed)
Dr Croitoru recommends that you schedule a follow-up appointment in 1 year. You will receive a reminder letter in the mail two months in advance. If you don't receive a letter, please call our office to schedule the follow-up appointment.  If you need a refill on your cardiac medications before your next appointment, please call your pharmacy. 

## 2016-10-26 NOTE — Progress Notes (Signed)
Cardiology Office Note    Date:  10/26/2016   ID:  Aaron Morales, DOB 1956/09/04, MRN [id number]  PCP:  Purvis Kilts, MD  Cardiologist:   Sanda Klein, MD   Chief complaint: Follow-up atrial fibrillation  History of Present Illness:  Aaron Morales is a 61 y.o. male with long-term persistent (probably permanent) atrial fibrillation without structural cardiac illness returning for follow-up. He was initially diagnosed with atrial fibrillation when he had a stroke in 2010. He has been on anticoagulation with warfarin and subsequently Pradaxa and has not had any new embolic events or new cardiac problems. He actually believes that he has fewer bleeding issues with the pradaxa (compared to warfarin) and has definitely not had any major bleeding complications.  He is physically active and denies problems with chest pain or dyspnea with activity. He has not had edema, claudication or palpitations. As always he is unaware of the arrhythmia. He has gained some weight and is in the moderately obese range now.  Past Medical History:  Diagnosis Date  . Carotid artery stenosis 08/03/2009   internal carotid artery stenosis on the right and no significant disease on the left as well as bilateral antegrade vertebral artery flow,  . CVA (cerebral vascular accident) ([place]) 08/02/2009   acute 2D Echo performed show no evidence of an internall cardiac thrombosis, atrial septal defect, or a significant aortic and mitral valve disease  . Dyslipidemia   . Hx of adenomatous polyp of colon 07/19/2015  . Hypertension   . OSA (obstructive sleep apnea) 09/01/09   sleep study performed did not reveal evidence of sleep apnea, REM was at 2.8hr, AHI  (5h57min) was 0.5/hr,   . Paroxysmal atrial fibrillation (HCC)   . Stroke [facility]) 07/2009   left frontal lobe stroke    Past Surgical History:  Procedure Laterality Date  . ACHILLES TENDON REPAIR Left   . COLONOSCOPY  2005   GI bleeding  . TONSILLECTOMY       Current Medications: Outpatient Medications Prior to Visit  Medication Sig Dispense Refill  . PRADAXA 150 MG CAPS capsule TAKE 1 CAPSULE(150 MG) BY MOUTH TWICE DAILY 60 capsule 10  . metoprolol succinate (TOPROL-XL) 50 MG 24 hr tablet Take 50 mg by mouth daily. Take with or immediately following a meal.     No facility-administered medications prior to visit.      Allergies:   Patient has no known allergies.   Social History   Social History  . Marital status: Married    Spouse name: N/A  . Number of children: 2  . Years of education: N/A   Occupational History  . financial advisor    Social History Main Topics  . Smoking status: Former Smoker    Types: Cigarettes    Quit date: 01/16/1987  . Smokeless tobacco: Never Used  . Alcohol use 0.0 oz/week     Comment: 2-3 per day  . Drug use: No  . Sexual activity: Not Asked   Other Topics Concern  . None   Social History Narrative   Married , lives with wife. He has 2 daughters.   Financial advisor   2 caffeinated beverages daily.   02/21/2015        Family History:  The patient's family history includes Aortic aneurysm in his paternal aunt; CVA in his maternal grandfather; Colon cancer in his maternal aunt; Lung cancer in his father; Transient ischemic attack in his mother.   ROS:   Please see  the history of present illness.    ROS All other systems reviewed and are negative.   PHYSICAL EXAM:   VS:  BP 120/88   Pulse 87   Ht 5\' 6"  (1.676 m)   Wt 211 lb 6.4 oz (95.9 kg)   BMI 34.12 kg/m    GEN: Well nourished, well developed, in no acute distress  HEENT: normal  Neck: no JVD, carotid bruits, or masses Cardiac: irregular; no murmurs, rubs, or gallops,no edema  Respiratory:  clear to auscultation bilaterally, normal work of breathing GI: soft, nontender, nondistended, + BS MS: no deformity or atrophy  Skin: warm and dry, no rash Neuro:  Alert and Oriented x 3, Strength and sensation are intact Psych:  euthymic mood, full affect  Wt Readings from Last 3 Encounters:  10/26/16 211 lb 6.4 oz (95.9 kg)  07/06/15 206 lb (93.4 kg)  06/24/15 207 lb 6.4 oz (94.1 kg)      Studies/Labs Reviewed:   EKG:  EKG is ordered today.  The ekg ordered today demonstrates Atrial fibrillation with a ventricular rate of 87 bpm, otherwise normal. QTC 435 ms  Recent Labs: No results found for requested labs within last 8760 hours.   Lipid Panel    Component Value Date/Time   CHOL  08/03/2009 0500    150        ATP III CLASSIFICATION:  <200     mg/dL   Desirable  200-239  mg/dL   Borderline High  >=240    mg/dL   High          TRIG 119 08/03/2009 0500   HDL 50 08/03/2009 0500   CHOLHDL 3.0 08/03/2009 0500   VLDL 24 08/03/2009 0500   LDLCALC  08/03/2009 0500    76        Total Cholesterol/HDL:CHD Risk Coronary Heart Disease Risk Table                     Men   Women  1/2 Average Risk   3.4   3.3  Average Risk       5.0   4.4  2 X Average Risk   9.6   7.1  3 X Average Risk  23.4   11.0        Use the calculated Patient Ratio above and the CHD Risk Table to determine the patient's CHD Risk.        ATP III CLASSIFICATION (LDL):  <100     mg/dL   Optimal  100-129  mg/dL   Near or Above                    Optimal  130-159  mg/dL   Borderline  160-189  mg/dL   High  >190     mg/dL   Very High      ASSESSMENT:    1. Longstanding persistent atrial fibrillation ([place])   2. History of cardioembolic stroke [id number]   3. Long term current use of anticoagulant      PLAN:  In order of problems listed above:  Aaron Morales has lone atrial fibrillation on appropriate chronic anticoagulation (history of embolic stroke: CHADSVasc 2) but without signs or symptoms of other cardiac illness. Ventricular rate control is good on a low dose of beta blocker. Pointed out that if he takes metoprolol tartrate this should be taken in two equally divided daily doses, every 12 hours. He prefers Pradaxa 12 are  anticoagulants due to the  availability of an antidote. He wants to continue taking this medication rather than Xarelto or Eliquis.    Medication Adjustments/Labs and Tests Ordered: Current medicines are reviewed at length with the patient today.  Concerns regarding medicines are outlined above.  Medication changes, Labs and Tests ordered today are listed in the Patient Instructions below. Patient Instructions  Dr Sallyanne Kuster recommends that you schedule a follow-up appointment in 1 year. You will receive a reminder letter in the mail two months in advance. If you don't receive a letter, please call our office to schedule the follow-up appointment.  If you need a refill on your cardiac medications before your next appointment, please call your pharmacy.    Signed, Sanda Klein, MD  10/26/2016 6:32 PM    [facility] [address], [place], [place]  [address] Phone: [phone]; Fax: [phone]

## 2017-05-13 ENCOUNTER — Telehealth: Payer: Self-pay | Admitting: Cardiovascular Disease

## 2017-05-13 NOTE — Telephone Encounter (Signed)
Follow up     Patient returning call back to triage.

## 2017-05-13 NOTE — Telephone Encounter (Signed)
Aaron Morales is calling about his prescription Pradaxa .States that the Cost of it is $400.00 Wants to know if the prescription needs to be changed or if he can get another discount card. Please call   Thanks

## 2017-05-13 NOTE — Telephone Encounter (Signed)
Medication Samples have been provided to the patient.  Drug name: Pradaxa       Strength: 150mg          Qty: 24 caps   LOT: [id number]    Exp.Date: 04/2019  The patient has been instructed regarding the correct time, dose, and frequency of taking this medication, including desired effects and most common side effects.   Teng Decou N Rodriguez-Guzman 4:39 PM 05/13/2017   **Patient reached co-pay card maximum of $2400 for this year. Will need to pay out of pocket or change therapy**

## 2017-05-20 ENCOUNTER — Telehealth: Payer: Self-pay | Admitting: Pharmacist

## 2017-05-20 MED ORDER — RIVAROXABAN 20 MG PO TABS: 20.0000 mg | ORAL_TABLET | Freq: Every day | ORAL | 5 refills | Status: AC

## 2017-05-20 NOTE — Telephone Encounter (Signed)
Patient's insurance has Pardaxa and Eliquis as tier 4. Xarelto as tier 3.  No contraindications to Xarelto noted. Recent blood work obtained from PCP DR Hilma Favors [facility], [place])  Rx for Xarelto 20mg  daily sent to prefer pharmacy 30 day  Free card and co-pay card given to patient.

## 2018-02-04 ENCOUNTER — Other Ambulatory Visit: Payer: Self-pay

## 2018-02-04 ENCOUNTER — Emergency Department (HOSPITAL_COMMUNITY): Payer: No Typology Code available for payment source

## 2018-02-04 ENCOUNTER — Ambulatory Visit (HOSPITAL_COMMUNITY)
Admission: EM | Admit: 2018-02-04 | Discharge: 2018-02-04 | Disposition: A | Discharge status: Other Acute Inpatient Hospital | Payer: BLUE CROSS/BLUE SHIELD

## 2018-02-04 ENCOUNTER — Emergency Department (HOSPITAL_COMMUNITY)
Admission: EM | Admit: 2018-02-04 | Discharge: 2018-02-04 | Disposition: A | Discharge status: Home / Self Care | Payer: No Typology Code available for payment source | Attending: Emergency Medicine | Admitting: Emergency Medicine

## 2018-02-04 ENCOUNTER — Encounter (HOSPITAL_COMMUNITY): Payer: Self-pay | Admitting: Emergency Medicine

## 2018-02-04 DIAGNOSIS — Z87891 Personal history of nicotine dependence: Secondary | ICD-10-CM | POA: Insufficient documentation

## 2018-02-04 DIAGNOSIS — M1711 Unilateral primary osteoarthritis, right knee: Secondary | ICD-10-CM | POA: Diagnosis not present

## 2018-02-04 DIAGNOSIS — M25561 Pain in right knee: Secondary | ICD-10-CM | POA: Diagnosis present

## 2018-02-04 DIAGNOSIS — Z79899 Other long term (current) drug therapy: Secondary | ICD-10-CM | POA: Diagnosis not present

## 2018-02-04 DIAGNOSIS — I10 Essential (primary) hypertension: Secondary | ICD-10-CM | POA: Insufficient documentation

## 2018-02-04 LAB — PROTIME-INR
INR: 3.07
PROTHROMBIN TIME: 31.5 s — AB (ref 11.4–15.2)

## 2018-02-04 MED ORDER — LIDOCAINE HCL 2 % IJ SOLN
INTRAMUSCULAR | Status: AC
  Filled 2018-02-04: qty 20

## 2018-02-04 NOTE — Discharge Instructions (Signed)
Please read attached information regarding your condition and RICE therapy. Take Tylenol as needed for pain.  Wear knee sleeve or Ace wrap as directed. Do not take any medications that are considered NSAIDs. Follow-up with the orthopedist listed below for further evaluation if symptoms persist and any advanced imaging. Follow-up with your primary care provider for further evaluation. Return to ED for worsening symptoms, injuries or falls, numbness in legs, red hot or tender joint with fever.

## 2018-02-04 NOTE — ED Provider Notes (Addendum)
[facility] EMERGENCY DEPARTMENT Provider Note   CSN: [id number] Arrival date & time: 02/04/18  1109     History   Chief Complaint Chief Complaint  Patient presents with  . Leg Pain    HPI Aaron Morales is a 62 y.o. male with a past medical history of hypertension, A. fib currently on warfarin, prior stroke, who presents to ED for evaluation of 1 day history of right knee pain and area of swelling.  He reports "aching" pain yesterday while at work which was unprovoked.  He then went on a [place] Walk which exacerbated the pain.  He took a few doses of Tylenol with no improvement in his symptoms.  He denies any injuries or falls.  Denies any previous fracture, dislocations or procedures in the area.  Denies any numbness in legs, recent surgeries, recent prolonged travel.   HPI  Past Medical History:  Diagnosis Date  . Carotid artery stenosis 08/03/2009   internal carotid artery stenosis on the right and no significant disease on the left as well as bilateral antegrade vertebral artery flow,  . CVA (cerebral vascular accident) ([place]) 08/02/2009   acute 2D Echo performed show no evidence of an internall cardiac thrombosis, atrial septal defect, or a significant aortic and mitral valve disease  . Dyslipidemia   . Hx of adenomatous polyp of colon 07/19/2015  . Hypertension   . OSA (obstructive sleep apnea) 09/01/09   sleep study performed did not reveal evidence of sleep apnea, REM was at 2.8hr, AHI  (5h31min) was 0.5/hr,   . Paroxysmal atrial fibrillation (HCC)   . Stroke [facility]) 07/2009   left frontal lobe stroke    Patient Active Problem List   Diagnosis Date Noted  . Long term current use of anticoagulant 10/26/2016  . Hx of adenomatous polyp of colon 07/19/2015  . Longstanding persistent atrial fibrillation ([place]) 03/12/2015  . History of cardioembolic stroke 8315 17/61/6073    Past Surgical History:  Procedure Laterality Date  . ACHILLES TENDON REPAIR  Left   . COLONOSCOPY  2005   GI bleeding  . TONSILLECTOMY          Home Medications    Prior to Admission medications   Medication Sig Start Date End Date Taking? Authorizing Provider  metoprolol (LOPRESSOR) 50 MG tablet Take 50 mg by mouth daily. 10/19/16   [provider]  rivaroxaban (XARELTO) 20 MG TABS tablet Take 1 tablet (20 mg total) by mouth daily with supper. To replace Pradaxa. 05/20/17   Croitoru, Dani Gobble, MD    Family History Family History  Problem Relation Age of Onset  . Transient ischemic attack Mother   . CVA Maternal Grandfather        also Aortic Aneurysm  . Lung cancer Father        small cell  . Aortic aneurysm Paternal Aunt   . Colon cancer Maternal Aunt     Social History Social History   Tobacco Use  . Smoking status: Former Smoker    Types: Cigarettes    Last attempt to quit: 01/16/1987    Years since quitting: 31.0  . Smokeless tobacco: Never Used  Substance Use Topics  . Alcohol use: Yes    Alcohol/week: 0.0 oz    Comment: 2-3 per day  . Drug use: No     Allergies   Patient has no known allergies.   Review of Systems Review of Systems  Constitutional: Negative for chills and fever.  Gastrointestinal: Negative  for nausea and vomiting.  Musculoskeletal: Positive for arthralgias. Negative for gait problem, joint swelling and myalgias.  Skin: Negative for wound.  Neurological: Negative for weakness and numbness.     Physical Exam Updated Vital Signs BP (!) 148/91 (BP Location: Right Arm)   Pulse 89   Temp 97.7 F (36.5 C)   Resp 16   SpO2 100%   Physical Exam  Constitutional: He appears well-developed and well-nourished. No distress.  Nontoxic appearing and in no acute distress.  HENT:  Head: Normocephalic and atraumatic.  Eyes: Conjunctivae and EOM are normal. No scleral icterus.  Neck: Normal range of motion.  Pulmonary/Chest: Effort normal. No respiratory distress.  Musculoskeletal: Normal range of motion. He  exhibits tenderness. He exhibits no edema or deformity.       Legs: TTP and mild edema of focal area lateral to the R kneecap. Pain with ROM although able to perform. No overlying erythema or warmth noted.  No calf tenderness bilaterally. 2+ DP pulse and normal sensation to touch.  Neurological: He is alert.  Skin: No rash noted. He is not diaphoretic.  Psychiatric: He has a normal mood and affect.  Nursing note and vitals reviewed.    ED Treatments / Results  Labs (all labs ordered are listed, but only abnormal results are displayed) Labs Reviewed  PROTIME-INR - Abnormal; Notable for the following components:      Result Value   Prothrombin Time 31.5 (*)    All other components within normal limits    EKG None  Radiology Dg Knee Complete 4 Views Right  Result Date: 02/04/2018 CLINICAL DATA:  Pain and swelling EXAM: RIGHT KNEE - COMPLETE 4+ VIEW COMPARISON:  None. FINDINGS: Frontal, lateral, and bilateral oblique views were obtained. There is no evident acute fracture or dislocation. There is a sizable joint effusion. There is moderate narrowing laterally with slightly milder narrowing in the patellofemoral joint. There is spurring along the posterior patella lateral compartments. No erosive change. There is mild chondrocalcinosis. IMPRESSION: Osteoarthritic change, most severely in the patellofemoral and lateral compartment regions. Sizable joint effusion. Mild chondrocalcinosis. No acute fracture or dislocation evident. Electronically Signed   By: Lowella Grip III M.D.   On: 02/04/2018 13:44    Procedures Procedures (including critical care time)  Medications Ordered in ED Medications - No data to display   Initial Impression / Assessment and Plan / ED Course  I have reviewed the triage vital signs and the nursing notes.  Pertinent labs & imaging results that were available during my care of the patient were reviewed by me and considered in my medical decision making  (see chart for details).     Presents to ED for evaluation of 1 day history of right knee pain and area of swelling.  Aching sensation, work tasks.  He then went on a walk for about 2 miles and this exacerbated the pain.  No improvement with Tylenol.  Denies any injuries or falls.  Denies any history of gout or septic joint, prior fracture, dislocation or procedure in the area.  Denies any recent surgeries or recent prolonged travel.  On physical exam he is overall well-appearing.  He has one area of focal edema noted of the lateral right patella with no edema or warmth noted.  X-ray shows osteoarthritic change most severely 4 compartments.  Joint effusion noted.  Doubt septic joint or other infectious or vascular cause of symptoms.  Symptoms most likely due to his osteoarthritis.  INR at baseline today.  Will encourage rice therapy, continue Tylenol and following up with Bobette Mo if symptoms persist.  Advised to follow-up with PCP for further evaluation if symptoms persist.  Advised to return for any severe worsening.  Portions of this note were generated with Lobbyist. Dictation errors may occur despite best attempts at proofreading.   Final Clinical Impressions(s) / ED Diagnoses   Final diagnoses:  Osteoarthritis of right knee, unspecified osteoarthritis type    ED Discharge Orders    None         Delia Heady, PA-C 02/04/18 1530    Lajean Saver, MD 02/05/18 1253

## 2018-02-04 NOTE — ED Triage Notes (Addendum)
Patient complains of right knee pain and swelling that started yesterday. Denies recent injury.

## 2018-02-04 NOTE — ED Notes (Signed)
Patient transported to X-ray 

## 2018-02-04 NOTE — ED Notes (Signed)
ED Provider at bedside. 

## 2018-04-19 IMAGING — CR DG KNEE COMPLETE 4+V*R*
4 series · 4 of 4 positions shown · non-contrast
Comparison: None.

CLINICAL DATA: Pain and swelling

EXAM:
RIGHT KNEE - COMPLETE 4+ VIEW

[knee ap]
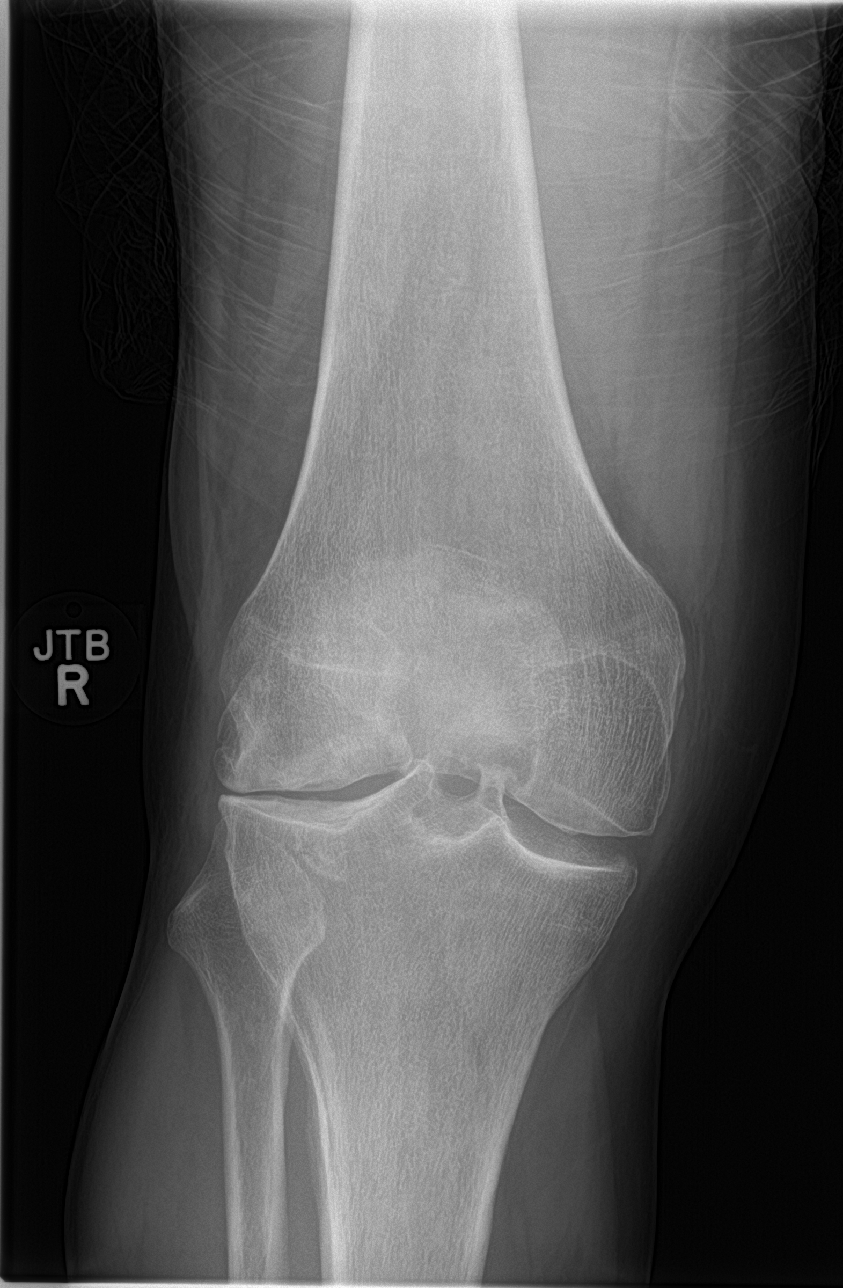

[knee lat]
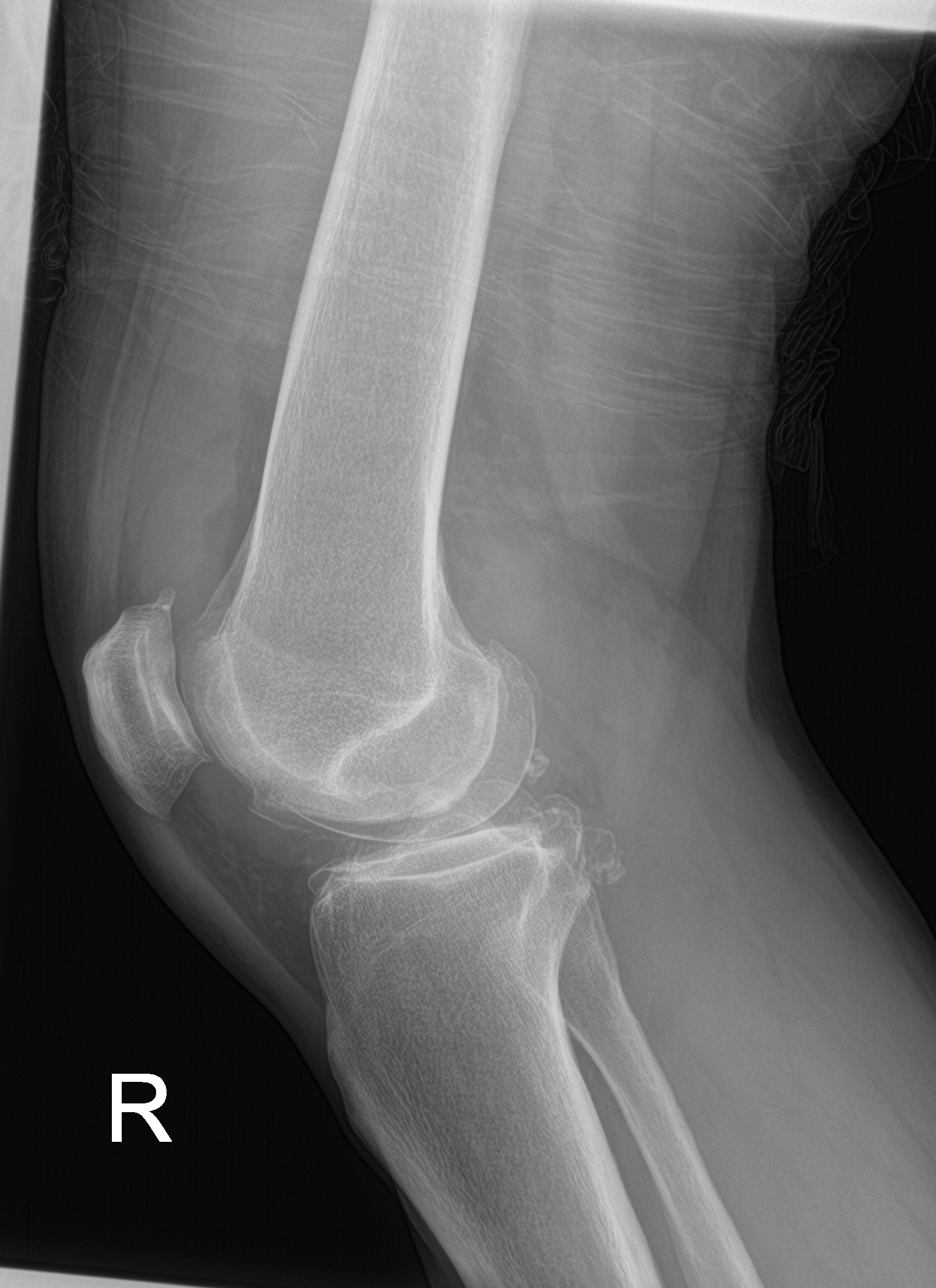

[knee obl (1 of 2)]
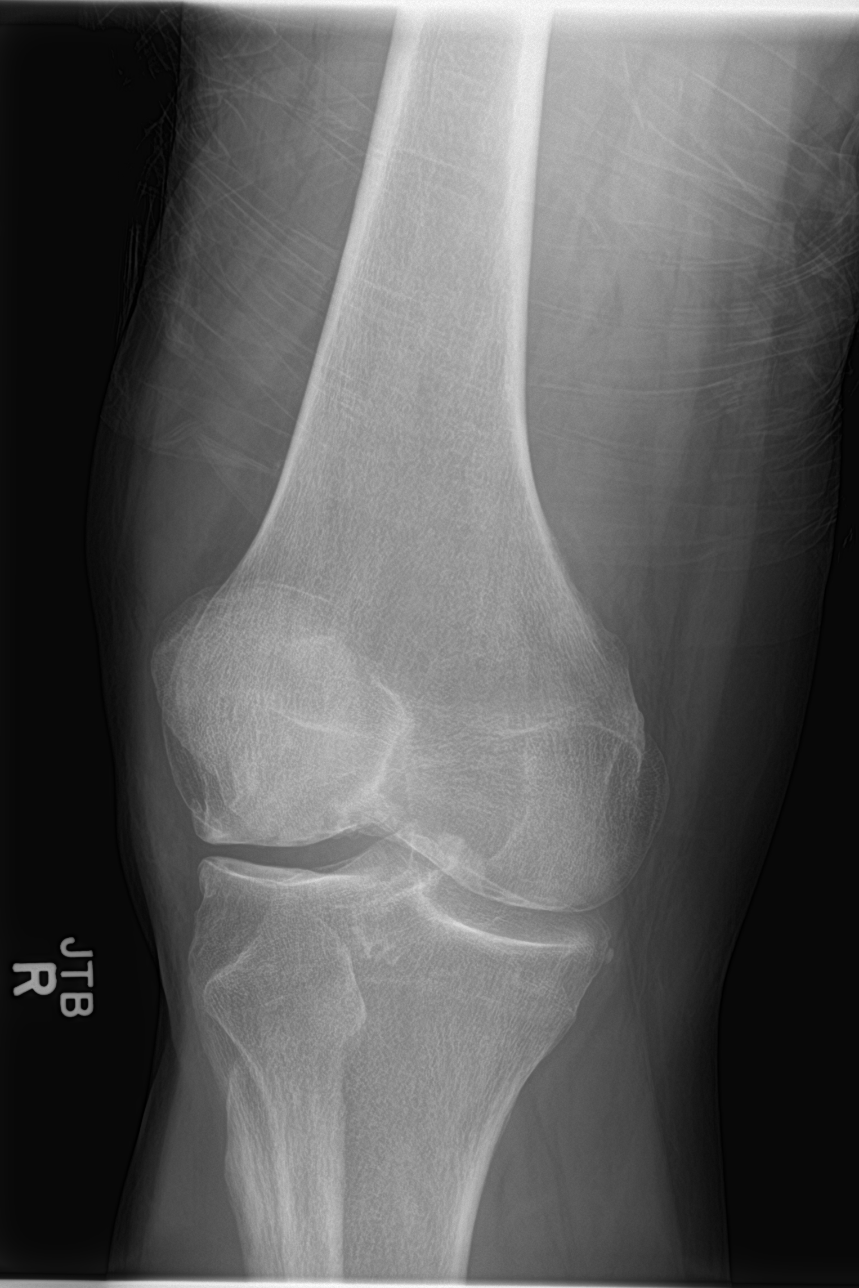

[knee obl (2 of 2)]
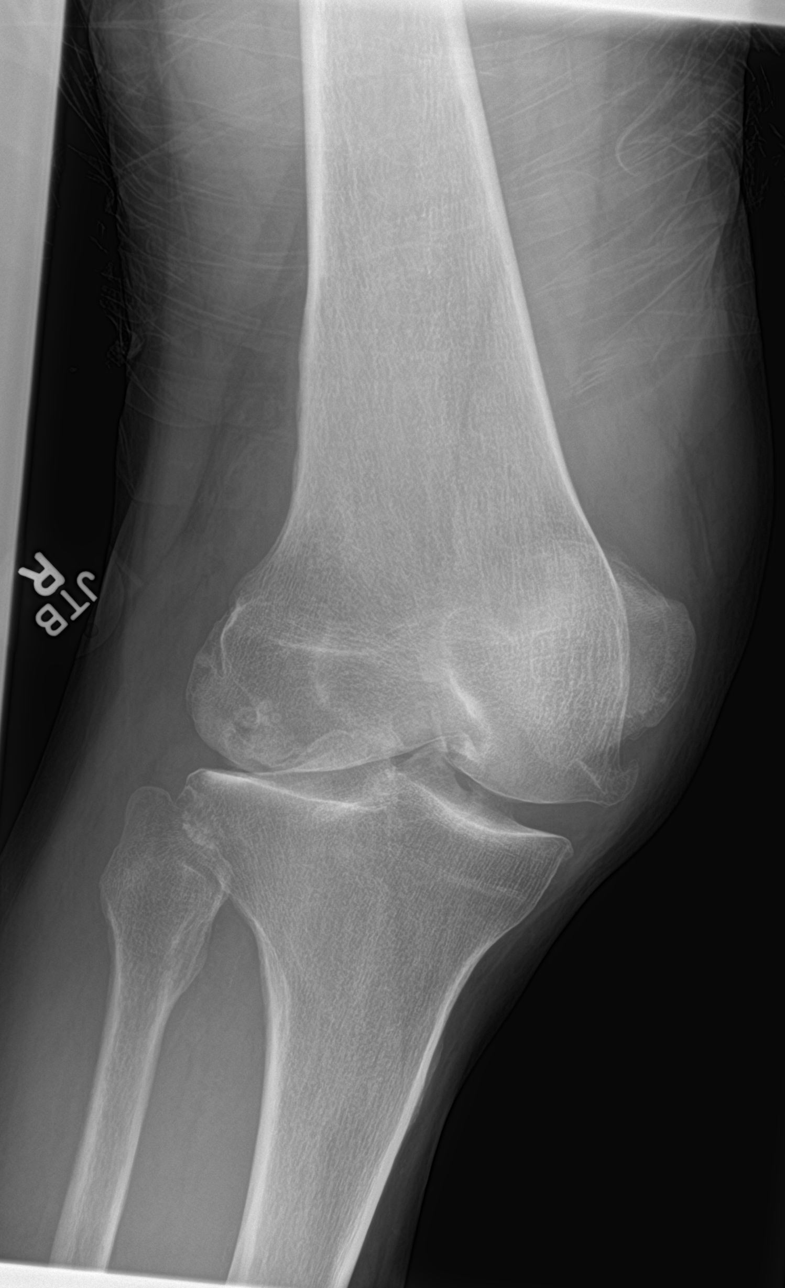

[4 of 4 positions shown; findings below may reference images not displayed]

FINDINGS: Frontal, lateral, and bilateral oblique views were obtained. There
is no evident acute fracture or dislocation. There is a sizable
joint effusion. There is moderate narrowing laterally with slightly
milder narrowing in the patellofemoral joint. There is spurring
along the posterior patella lateral compartments. No erosive change.
There is mild chondrocalcinosis.
IMPRESSION: Osteoarthritic change, most severely in the patellofemoral and
lateral compartment regions. Sizable joint effusion. Mild
chondrocalcinosis. No acute fracture or dislocation evident.

## 2019-12-28 ENCOUNTER — Ambulatory Visit: Payer: PRIVATE HEALTH INSURANCE | Attending: Internal Medicine

## 2019-12-28 DIAGNOSIS — Z23 Encounter for immunization: Secondary | ICD-10-CM

## 2019-12-28 NOTE — Progress Notes (Signed)
   Covid-19 Vaccination Clinic  Name:  Aaron Morales    MRN: [id number] DOB: 1956-08-23  12/28/2019  [name] was observed post Covid-19 immunization for 15 minutes without incident. He was provided with Vaccine Information Sheet and instruction to access the V-Safe system.   [name] was instructed to call 911 with any severe reactions post vaccine: [name] Kitchen Difficulty breathing  . Swelling of face and throat  . A fast heartbeat  . A bad rash all over body  . Dizziness and weakness   Immunizations Administered    Name Date Dose VIS Date Route   Pfizer COVID-19 Vaccine 12/28/2019  9:13 AM 0.3 mL 10/02/2019 Intramuscular   Manufacturer: [place]   Lot: [id number]   [place]: [id number]

## 2020-02-02 ENCOUNTER — Ambulatory Visit: Payer: PRIVATE HEALTH INSURANCE | Attending: Internal Medicine

## 2020-02-02 DIAGNOSIS — Z23 Encounter for immunization: Secondary | ICD-10-CM

## 2020-02-02 NOTE — Progress Notes (Signed)
   Covid-19 Vaccination Clinic  Name:  Aaron Morales    MRN: [id number] DOB: 09-18-1956  02/02/2020  [name] was observed post Covid-19 immunization for 15 minutes without incident. He was provided with Vaccine Information Sheet and instruction to access the V-Safe system.   [name] was instructed to call 911 with any severe reactions post vaccine: [name] Kitchen Difficulty breathing  . Swelling of face and throat  . A fast heartbeat  . A bad rash all over body  . Dizziness and weakness   Immunizations Administered    Name Date Dose VIS Date Route   Pfizer COVID-19 Vaccine 02/02/2020  8:24 AM 0.3 mL 10/02/2019 Intramuscular   Manufacturer: [place]   Lot: [id number]   [place]: [id number]

## 2021-12-22 DIAGNOSIS — Z0001 Encounter for general adult medical examination with abnormal findings: Secondary | ICD-10-CM | POA: Diagnosis not present

## 2021-12-22 DIAGNOSIS — I10 Essential (primary) hypertension: Secondary | ICD-10-CM | POA: Diagnosis not present

## 2022-02-13 DIAGNOSIS — L559 Sunburn, unspecified: Secondary | ICD-10-CM | POA: Diagnosis not present

## 2022-08-17 ENCOUNTER — Encounter: Payer: Self-pay | Admitting: Internal Medicine

## 2023-01-10 DIAGNOSIS — I10 Essential (primary) hypertension: Secondary | ICD-10-CM | POA: Diagnosis not present

## 2023-01-10 DIAGNOSIS — Z1331 Encounter for screening for depression: Secondary | ICD-10-CM | POA: Diagnosis not present

## 2023-01-10 DIAGNOSIS — I4891 Unspecified atrial fibrillation: Secondary | ICD-10-CM | POA: Diagnosis not present

## 2023-01-10 DIAGNOSIS — E6609 Other obesity due to excess calories: Secondary | ICD-10-CM | POA: Diagnosis not present

## 2023-01-10 DIAGNOSIS — Z6833 Body mass index (BMI) 33.0-33.9, adult: Secondary | ICD-10-CM | POA: Diagnosis not present

## 2023-01-10 DIAGNOSIS — Z0001 Encounter for general adult medical examination with abnormal findings: Secondary | ICD-10-CM | POA: Diagnosis not present

## 2023-09-12 ENCOUNTER — Other Ambulatory Visit (HOSPITAL_COMMUNITY): Payer: Self-pay | Admitting: Family Medicine

## 2023-09-12 DIAGNOSIS — Z6834 Body mass index (BMI) 34.0-34.9, adult: Secondary | ICD-10-CM | POA: Diagnosis not present

## 2023-09-12 DIAGNOSIS — I10 Essential (primary) hypertension: Secondary | ICD-10-CM | POA: Diagnosis not present

## 2023-09-12 DIAGNOSIS — F17201 Nicotine dependence, unspecified, in remission: Secondary | ICD-10-CM

## 2023-09-12 DIAGNOSIS — E6609 Other obesity due to excess calories: Secondary | ICD-10-CM | POA: Diagnosis not present

## 2023-09-12 DIAGNOSIS — F17210 Nicotine dependence, cigarettes, uncomplicated: Secondary | ICD-10-CM | POA: Diagnosis not present

## 2023-09-23 DIAGNOSIS — I4891 Unspecified atrial fibrillation: Secondary | ICD-10-CM | POA: Diagnosis not present

## 2023-09-23 DIAGNOSIS — Z7901 Long term (current) use of anticoagulants: Secondary | ICD-10-CM | POA: Diagnosis not present

## 2023-09-23 DIAGNOSIS — E669 Obesity, unspecified: Secondary | ICD-10-CM | POA: Diagnosis not present

## 2023-09-23 DIAGNOSIS — Z87891 Personal history of nicotine dependence: Secondary | ICD-10-CM | POA: Diagnosis not present

## 2023-09-23 DIAGNOSIS — M1990 Unspecified osteoarthritis, unspecified site: Secondary | ICD-10-CM | POA: Diagnosis not present

## 2023-09-27 ENCOUNTER — Ambulatory Visit (HOSPITAL_COMMUNITY): Payer: PRIVATE HEALTH INSURANCE

## 2023-10-02 ENCOUNTER — Ambulatory Visit (HOSPITAL_COMMUNITY)
Admission: RE | Admit: 2023-10-02 | Discharge: 2023-10-02 | Disposition: A | Discharge status: Home / Self Care | Payer: HMO | Source: Ambulatory Visit | Attending: Family Medicine | Admitting: Family Medicine

## 2023-10-02 DIAGNOSIS — F17201 Nicotine dependence, unspecified, in remission: Secondary | ICD-10-CM | POA: Insufficient documentation

## 2023-10-02 DIAGNOSIS — Z136 Encounter for screening for cardiovascular disorders: Secondary | ICD-10-CM | POA: Insufficient documentation

## 2023-10-02 DIAGNOSIS — Z87891 Personal history of nicotine dependence: Secondary | ICD-10-CM | POA: Insufficient documentation

## 2023-12-17 ENCOUNTER — Encounter: Payer: Self-pay | Admitting: Physician Assistant

## 2023-12-17 ENCOUNTER — Ambulatory Visit: Payer: HMO | Admitting: Physician Assistant

## 2023-12-17 ENCOUNTER — Telehealth: Payer: Self-pay

## 2023-12-17 VITALS — BP 132/88 | HR 90 | Ht 65.5 in | Wt 229.0 lb

## 2023-12-17 DIAGNOSIS — Z7901 Long term (current) use of anticoagulants: Secondary | ICD-10-CM

## 2023-12-17 DIAGNOSIS — Z860101 Personal history of adenomatous and serrated colon polyps: Secondary | ICD-10-CM

## 2023-12-17 DIAGNOSIS — I4891 Unspecified atrial fibrillation: Secondary | ICD-10-CM

## 2023-12-17 DIAGNOSIS — I4811 Longstanding persistent atrial fibrillation: Secondary | ICD-10-CM

## 2023-12-17 MED ORDER — SUTAB 1479-225-188 MG PO TABS: ORAL_TABLET | ORAL | 0 refills | Status: DC

## 2023-12-17 NOTE — Patient Instructions (Signed)
 You have been scheduled for a colonoscopy. Please follow written instructions given to you at your visit today.   If you use inhalers (even only as needed), please bring them with you on the day of your procedure.  DO NOT TAKE 7 DAYS PRIOR TO TEST- Trulicity (dulaglutide) Ozempic, Wegovy (semaglutide) Mounjaro (tirzepatide) Bydureon Bcise (exanatide extended release)  DO NOT TAKE 1 DAY PRIOR TO YOUR TEST Rybelsus (semaglutide) Adlyxin (lixisenatide) Victoza (liraglutide) Byetta (exanatide)   You will be contacted by our office prior to your procedure for directions on holding your Xarelto.  If you do not hear from our office 1 week prior to your scheduled procedure, please call [phone] to discuss.   _______________________________________________________________________  Bonita Quin will receive your bowel preparation through [facility], which ensures the lowest copay and home delivery, with outreach via text or call from an [phone] number. Please respond promptly to avoid rescheduling of your procedure. If you are interested in alternative options or have any questions regarding your prep, please contact them at [phone] ____________________________________________________________________________  Your Provider Has Sent Your Bowel Prep Regimen To [facility]   [facility] will contact you to verify your information and collect your copay, if applicable. Enjoy the comfort of your home while your prescription is mailed to you, FREE of any shipping charges.   [facility] accepts all major insurance benefits and applies discounts & coupons.  Have additional questions?   Chat: [email or web address] Call: [phone] Email: [email or web address] NCPDP: [id number]  How will [facility] contact you?  With a Welcome phone call,  a Welcome text and a checkout link in text form.  Texts you receive from [phone] Are NOT Spam.  *To set up delivery, you must complete the checkout  process via link or speak to one of the patient care representatives. If [facility] is unable to reach you, your prescription may be delayed.  To avoid long hold times on the phone, you may also utilize the secure chat feature on the [facility] website to request that they call you back for transaction completion or to expedite your concerns.

## 2023-12-17 NOTE — Telephone Encounter (Signed)
 URHO RIO 06/01/1956 [id number]  12/17/2023   Dear Terie Purser, PA-C:  We have scheduled the above named patient for a(n) endoscopic procedure. Our records show that (s)he is on anticoagulation therapy.  Please advise as to whether the patient may come off their therapy of Xarelto 2 days prior to their procedure which is scheduled for 01/23/24.  Please route your response to [facility], CMA or fax response to [phone].  Sincerely,    [facility]

## 2023-12-17 NOTE — Progress Notes (Signed)
 Chief Complaint: Discuss colonoscopy  HPI:    Aaron Morales is a 68 year old male with past medical history as listed below including CVA, CAD, OSA, A-fib on Xarelto and multiple others, known to Dr. Leone Payor, who was referred to me by Assunta Found, MD for discussion of a colonoscopy.    07/06/2015 colonoscopy with a 4 mm sessile polyp in the cecum and moderate diverticulosis in the sigmoid colon.  Pathology showed adenoma and repeat recommended in 7 years.    Today, patient presents to clinic and tells me that he is doing well.  He does use Metamucil daily which helps him to maintain regular bowel movements.  No GI complaints or concerns.  Aware that it is time for his next colonoscopy.    Denies fever, chills, weight loss, blood in the stool, nausea, vomiting or abdominal pain.  Past Medical History:  Diagnosis Date   Carotid artery stenosis 08/03/2009   internal carotid artery stenosis on the right and no significant disease on the left as well as bilateral antegrade vertebral artery flow,   CVA (cerebral vascular accident) (HCC) 08/02/2009   acute 2D Echo performed show no evidence of an internall cardiac thrombosis, atrial septal defect, or a significant aortic and mitral valve disease   Dyslipidemia    Hx of adenomatous polyp of colon 07/19/2015   Hypertension    OSA (obstructive sleep apnea) 09/01/09   sleep study performed did not reveal evidence of sleep apnea, REM was at 2.8hr, AHI  (5h31min) was 0.5/hr,    Paroxysmal atrial fibrillation (HCC)    Stroke (HCC) 07/2009   left frontal lobe stroke    Past Surgical History:  Procedure Laterality Date   ACHILLES TENDON REPAIR Left    COLONOSCOPY  2005   GI bleeding   TONSILLECTOMY      Current Outpatient Medications  Medication Sig Dispense Refill   metoprolol (LOPRESSOR) 50 MG tablet Take 50 mg by mouth daily.  3   rivaroxaban (XARELTO) 20 MG TABS tablet Take 1 tablet (20 mg total) by mouth daily with supper. To replace  Pradaxa. 30 tablet 5   No current facility-administered medications for this visit.    Allergies as of 12/17/2023   (No Known Allergies)    Family History  Problem Relation Age of Onset   Transient ischemic attack Mother    CVA Maternal Grandfather        also Aortic Aneurysm   Lung cancer Father        small cell   Aortic aneurysm Paternal Aunt    Colon cancer Maternal Aunt     Social History   Socioeconomic History   Marital status: Married    Spouse name: Not on file   Number of children: 2   Years of education: Not on file   Highest education level: Not on file  Occupational History   Occupation: financial advisor  Tobacco Use   Smoking status: Former    Current packs/day: 0.00    Types: Cigarettes    Quit date: 01/16/1987    Years since quitting: 36.9   Smokeless tobacco: Never  Substance and Sexual Activity   Alcohol use: Yes    Alcohol/week: 0.0 standard drinks of alcohol    Comment: 2-3 per day   Drug use: No   Sexual activity: Not on file  Other Topics Concern   Not on file  Social History Narrative   Married , lives with wife. He has 2 daughters.   Firefighter  2 caffeinated beverages daily.   02/21/2015   Social Drivers of Corporate investment banker Strain: Not on file  Food Insecurity: Not on file  Transportation Needs: Not on file  Physical Activity: Not on file  Stress: Not on file  Social Connections: Not on file  Intimate Partner Violence: Not on file    Review of Systems:    Constitutional: No weight loss, fever or chills Skin: No rash  Cardiovascular: No chest pain Respiratory: No SOB  Gastrointestinal: See HPI and otherwise negative Genitourinary: No dysuria Neurological: No headache, dizziness or syncope Musculoskeletal: No new muscle or joint pain Hematologic: No bleeding  Psychiatric: No history of depression or anxiety   Physical Exam:  Vital signs: BP 132/88   Pulse 90   Ht 5' 5.5" (1.664 m)   Wt 229 lb (103.9  kg)   BMI 37.53 kg/m    Constitutional:   Pleasant overweight Caucasian male appears to be in NAD, Well developed, Well nourished, alert and cooperative Head:  Normocephalic and atraumatic. Eyes:   PEERL, EOMI. No icterus. Conjunctiva pink. Ears:  Normal auditory acuity. Neck:  Supple Throat: Oral cavity and pharynx without inflammation, swelling or lesion.  Respiratory: Respirations even and unlabored. Lungs clear to auscultation bilaterally.   No wheezes, crackles, or rhonchi.  Cardiovascular: Normal S1, S2. No MRG. +irregularly irregular rate. No peripheral edema, cyanosis or pallor.  Gastrointestinal:  Soft, nondistended, nontender. No rebound or guarding. Normal bowel sounds. No appreciable masses or hepatomegaly. Rectal:  Not performed.  Msk:  Symmetrical without gross deformities. Without edema, no deformity or joint abnormality.  Neurologic:  Alert and  oriented x4;  grossly normal neurologically.  Skin:   Dry and intact without significant lesions or rashes. Psychiatric: Demonstrates good judgement and reason without abnormal affect or behaviors.  RELEVANT LABS AND IMAGING: CBC    Component Value Date/Time   WBC 7.8 08/04/2009 0534   RBC 5.60 08/04/2009 0534   HGB 16.8 08/04/2009 0534   HCT 49.7 08/04/2009 0534   PLT 210 08/04/2009 0534   MCV 88.7 08/04/2009 0534   MCHC 33.8 08/04/2009 0534   RDW 12.9 08/04/2009 0534   LYMPHSABS 1.3 08/02/2009 0901   MONOABS 0.3 08/02/2009 0901   EOSABS 0.1 08/02/2009 0901   BASOSABS 0.0 08/02/2009 0901    CMP     Component Value Date/Time   NA 139 08/04/2009 0534   K 4.3 08/04/2009 0534   CL 105 08/04/2009 0534   CO2 28 08/04/2009 0534   GLUCOSE 95 08/04/2009 0534   BUN 13 08/04/2009 0534   CREATININE 1.01 08/04/2009 0534   CALCIUM 9.3 08/04/2009 0534   PROT 7.2 08/02/2009 1430   ALBUMIN 4.1 08/02/2009 1430   AST 26 08/02/2009 1430   ALT 26 08/02/2009 1430   ALKPHOS 62 08/02/2009 1430   BILITOT 0.6 08/02/2009 1430    GFRNONAA >60 08/04/2009 0534   GFRAA  08/04/2009 0534    >60        The eGFR has been calculated using the MDRD equation. This calculation has not been validated in all clinical situations. eGFR's persistently <60 mL/min signify possible Chronic Kidney Disease.    Assessment: 1.  History of adenomatous polyp: Last colonoscopy in 2016 with repeat recommended in 7 to 10 years given 1 small adenomatous polyps 4 mm 2.  A-fib on chronic anticoagulation with Xarelto  Plan: 1.  Patient scheduled for a surveillance colonoscopy in the [facility] with Dr. Leone Payor.  Did provide the patient a  detailed list of risks for the procedure and he agrees to proceed. Patient is appropriate for endoscopic procedure(s) in the ambulatory ([facility]) setting.  2.  Patient advised to hold his Xarelto for 2 days prior to time of procedure.  We will communicate with his prescribing physician Aaron Purser, PA-C to ensure this is acceptable for him. 3.  Patient to follow in clinic per recommendations from Dr. Leone Payor after time of procedure.  Hyacinth Meeker, PA-C  Gastroenterology 12/17/2023, 2:08 PM  Cc: Assunta Found, MD

## 2023-12-19 ENCOUNTER — Telehealth: Payer: Self-pay

## 2023-12-19 NOTE — Telephone Encounter (Signed)
 Lmtcb.  (Re: His doctor approved the 2 day hold on his Xarelto).

## 2023-12-19 NOTE — Telephone Encounter (Signed)
 Faxed response received from Terie Purser, PA-C stating that Mr. Gersten can hold his Xarelto for 2 days prior to his procedure.

## 2023-12-19 NOTE — Telephone Encounter (Signed)
 Patient has been made aware.

## 2023-12-24 NOTE — Telephone Encounter (Signed)
 I spoke to Aaron Morales and I advised him that his doctor approved the 2 day hold on his Xarelto prior to his procedure scheduled for 01/23/24.  Patient agreed with the plan of care and thanked me for calling.

## 2024-01-15 ENCOUNTER — Encounter: Payer: Self-pay | Admitting: Internal Medicine

## 2024-01-16 DIAGNOSIS — I4891 Unspecified atrial fibrillation: Secondary | ICD-10-CM | POA: Diagnosis not present

## 2024-01-16 DIAGNOSIS — Z1331 Encounter for screening for depression: Secondary | ICD-10-CM | POA: Diagnosis not present

## 2024-01-16 DIAGNOSIS — Z0001 Encounter for general adult medical examination with abnormal findings: Secondary | ICD-10-CM | POA: Diagnosis not present

## 2024-01-16 DIAGNOSIS — I10 Essential (primary) hypertension: Secondary | ICD-10-CM | POA: Diagnosis not present

## 2024-01-16 DIAGNOSIS — E6609 Other obesity due to excess calories: Secondary | ICD-10-CM | POA: Diagnosis not present

## 2024-01-16 DIAGNOSIS — Z6834 Body mass index (BMI) 34.0-34.9, adult: Secondary | ICD-10-CM | POA: Diagnosis not present

## 2024-01-20 ENCOUNTER — Encounter: Payer: Self-pay | Admitting: Certified Registered Nurse Anesthetist

## 2024-01-22 NOTE — Progress Notes (Unsigned)
 [facility] History and Physical   Primary Care Physician:  Avis Epley, PA-C   Reason for Procedure:   Hx colon polyp  Plan:    colonoscopy     HPI: Aaron Morales is a 68 y.o. male on Xarelto here for surveillance colonoscopy. S/P 4 mm adenoma removal 2016, also had sigmoid diverticulosis. Xarelto is held.   Past Medical History:  Diagnosis Date   Carotid artery stenosis 08/03/2009   internal carotid artery stenosis on the right and no significant disease on the left as well as bilateral antegrade vertebral artery flow,   CVA (cerebral vascular accident) (HCC) 08/02/2009   acute 2D Echo performed show no evidence of an internall cardiac thrombosis, atrial septal defect, or a significant aortic and mitral valve disease   Dyslipidemia    Hx of adenomatous polyp of colon 07/19/2015   Hypertension    OSA (obstructive sleep apnea) 09/01/09   sleep study performed did not reveal evidence of sleep apnea, REM was at 2.8hr, AHI  (5h29min) was 0.5/hr,    Paroxysmal atrial fibrillation (HCC)    Stroke (HCC) 07/2009   left frontal lobe stroke    Past Surgical History:  Procedure Laterality Date   ACHILLES TENDON REPAIR Left    COLONOSCOPY  2005   GI bleeding   TONSILLECTOMY      Prior to Admission medications   Medication Sig Start Date End Date Taking? Authorizing Provider  metoprolol (LOPRESSOR) 50 MG tablet Take 50 mg by mouth daily. 10/19/16   [provider]  rivaroxaban (XARELTO) 20 MG TABS tablet Take 1 tablet (20 mg total) by mouth daily with supper. To replace Pradaxa. 05/20/17   Croitoru, Mihai, MD  Sodium Sulfate-Mag Sulfate-KCl (SUTAB) [phone] MG TABS Use as directed for colonoscopy. MANUFACTURER CODES!! BIN: [id number] PCN: CN GROUP: [id number] MEMBER ID: [id number];[employer] AS SECONDARY INSURANCE ;NO PRIOR AUTHORIZATION 12/17/23   Unk Lightning, PA    Current Outpatient Medications  Medication Sig Dispense Refill   metoprolol  (LOPRESSOR) 50 MG tablet Take 50 mg by mouth daily.  3   rivaroxaban (XARELTO) 20 MG TABS tablet Take 1 tablet (20 mg total) by mouth daily with supper. To replace Pradaxa. 30 tablet 5   Current Facility-Administered Medications  Medication Dose Route Frequency Provider Last Rate Last Admin   0.9 %  sodium chloride infusion  500 mL Intravenous Continuous Iva Boop, MD        Allergies as of 01/23/2024   (No Known Allergies)    Family History  Problem Relation Age of Onset   Transient ischemic attack Mother    CVA Maternal Grandfather        also Aortic Aneurysm   Lung cancer Father        small cell   Aortic aneurysm Paternal Aunt    Colon cancer Maternal Aunt     Social History   Socioeconomic History   Marital status: Married    Spouse name: Not on file   Number of children: 2   Years of education: Not on file   Highest education level: Not on file  Occupational History   Occupation: financial advisor  Tobacco Use   Smoking status: Former    Current packs/day: 0.00    Types: Cigarettes    Quit date: 01/16/1987    Years since quitting: 37.0   Smokeless tobacco: Never  Substance and Sexual Activity   Alcohol use: Yes    Alcohol/week: 0.0 standard drinks of alcohol  Comment: 2-3 per day   Drug use: No   Sexual activity: Not on file  Other Topics Concern   Not on file  Social History Narrative   Married , lives with wife. He has 2 daughters.   Financial advisor   2 caffeinated beverages daily.   02/21/2015   Social Drivers of Corporate investment banker Strain: Not on file  Food Insecurity: Not on file  Transportation Needs: Not on file  Physical Activity: Not on file  Stress: Not on file  Social Connections: Not on file  Intimate Partner Violence: Not on file    Review of Systems:  All other review of systems negative except as mentioned in the HPI.  Physical Exam: Vital signs BP (!) 139/93   Pulse 80   Temp 98.4 F (36.9 C)   Ht 5\' 5"   (1.651 m)   Wt 229 lb (103.9 kg)   SpO2 97%   BMI 38.11 kg/m   General:   Alert,  Well-developed, well-nourished, pleasant and cooperative in NAD Lungs:  Clear throughout to auscultation.   Heart:  Regular rate and irreg rhythm; no murmurs, clicks, rubs,  or gallops. Abdomen:  Soft, nontender and nondistended. Normal bowel sounds.   Neuro/Psych:  Alert and cooperative. Normal mood and affect. A and O x 3   @Jesus Nevills  Sena Slate, MD, [facility] [phone] (pager) 01/23/2024 7:54 AM@

## 2024-01-23 ENCOUNTER — Encounter: Payer: Self-pay | Admitting: Internal Medicine

## 2024-01-23 ENCOUNTER — Ambulatory Visit: Payer: HMO | Admitting: Internal Medicine

## 2024-01-23 VITALS — BP 117/91 | HR 85 | Temp 98.4°F | Resp 17 | Ht 65.0 in | Wt 229.0 lb

## 2024-01-23 DIAGNOSIS — Z1211 Encounter for screening for malignant neoplasm of colon: Secondary | ICD-10-CM

## 2024-01-23 DIAGNOSIS — K5730 Diverticulosis of large intestine without perforation or abscess without bleeding: Secondary | ICD-10-CM

## 2024-01-23 DIAGNOSIS — Z860101 Personal history of adenomatous and serrated colon polyps: Secondary | ICD-10-CM | POA: Diagnosis not present

## 2024-01-23 DIAGNOSIS — G4733 Obstructive sleep apnea (adult) (pediatric): Secondary | ICD-10-CM | POA: Diagnosis not present

## 2024-01-23 DIAGNOSIS — I10 Essential (primary) hypertension: Secondary | ICD-10-CM | POA: Diagnosis not present

## 2024-01-23 MED ORDER — SODIUM CHLORIDE 0.9 % IV SOLN: 500.0000 mL | INTRAVENOUS | Status: DC

## 2024-01-23 NOTE — Progress Notes (Signed)
 Report given to PACU, vss

## 2024-01-23 NOTE — Progress Notes (Signed)
 0800 BP 167/100, Labetalol given IV, MD update, vss

## 2024-01-23 NOTE — Op Note (Signed)
 [facility] Patient Name: Aaron Morales Procedure Date: 01/23/2024 7:57 AM MRN: [id number] Endoscopist: Iva Boop , MD, [id number] Age: 68 Referring MD:  Date of Birth: [birth year] Gender: Male Account #: [id number] Procedure:                Colonoscopy Indications:              Surveillance: Personal history of adenomatous                            polyps on last colonoscopy > 5 years ago, Last                            colonoscopy: 2016 Medicines:                Monitored Anesthesia Care Procedure:                Pre-Anesthesia Assessment:                           - Prior to the procedure, a History and Physical                            was performed, and patient medications and                            allergies were reviewed. The patient's tolerance of                            previous anesthesia was also reviewed. The risks                            and benefits of the procedure and the sedation                            options and risks were discussed with the patient.                            All questions were answered, and informed consent                            was obtained. Prior Anticoagulants: The patient                            last took Xarelto (rivaroxaban) 2 days prior to the                            procedure. ASA Grade Assessment: III - A patient                            with severe systemic disease. After reviewing the                            risks and benefits, the patient was deemed in  satisfactory condition to undergo the procedure.                           After obtaining informed consent, the colonoscope                            was passed under direct vision. Throughout the                            procedure, the patient's blood pressure, pulse, and                            oxygen saturations were monitored continuously. The                            CF [id number] #[id number] was introduced  through the anus                            and advanced to the the cecum, identified by                            appendiceal orifice and ileocecal valve. The                            colonoscopy was performed without difficulty. The                            patient tolerated the procedure well. The quality                            of the bowel preparation was good. The ileocecal                            valve, appendiceal orifice, and rectum were                            photographed. The bowel preparation used was SUTAB                            via split dose instruction. Scope In: 8:01:34 AM Scope Out: 8:15:54 AM Scope Withdrawal Time: 0 hours 10 minutes 30 seconds  Total Procedure Duration: 0 hours 14 minutes 20 seconds  Findings:                 The perianal and digital rectal examinations were                            normal.                           Multiple diverticula were found in the sigmoid                            colon and ascending colon.  The exam was otherwise without abnormality on                            direct and retroflexion views. Complications:            No immediate complications. Estimated Blood Loss:     Estimated blood loss: none. Impression:               - Moderate diverticulosis in the sigmoid colon and                            in the ascending colon.                           - The examination was otherwise normal on direct                            and retroflexion views.                           - No specimens collected.                           - Personal history of colonic polyp 4 mm adenoma                            removed 2016. Recommendation:           - Patient has a contact number available for                            emergencies. The signs and symptoms of potential                            delayed complications were discussed with the                            patient. Return to  normal activities tomorrow.                            Written discharge instructions were provided to the                            patient.                           - Resume previous diet.                           - Continue present medications.                           - Resume Xarelto (rivaroxaban) at prior dose today.                           - Repeat colonoscopy in 10 years. Iva Boop, MD 01/23/2024 8:22:39 AM This report has been signed electronically.

## 2024-01-23 NOTE — Patient Instructions (Addendum)
 No polyps or cancer seen.  You still have diverticulosis - thickened muscle rings and pouches in the colon wall. Please read the handout about this condition.  Next routine colonoscopy 10 years - 2035.  I appreciate the opportunity to care for you. Iva Boop, MD, [facility]  -Handout on diverticulosis provided -repeat colonoscopy for surveillance in 10 years recommended   -Continue present medications  -Resume Xarelto (rivaroxaban) at prior dose today    YOU HAD AN ENDOSCOPIC PROCEDURE TODAY AT THE [facility]:   Refer to the procedure report that was given to you for any specific questions about what was found during the examination.  If the procedure report does not answer your questions, please call your gastroenterologist to clarify.  If you requested that your care partner not be given the details of your procedure findings, then the procedure report has been included in a sealed envelope for you to review at your convenience later.  YOU SHOULD EXPECT: Some feelings of bloating in the abdomen. Passage of more gas than usual.  Walking can help get rid of the air that was put into your GI tract during the procedure and reduce the bloating. If you had a lower endoscopy (such as a colonoscopy or flexible sigmoidoscopy) you may notice spotting of blood in your stool or on the toilet paper. If you underwent a bowel prep for your procedure, you may not have a normal bowel movement for a few days.  Please Note:  You might notice some irritation and congestion in your nose or some drainage.  This is from the oxygen used during your procedure.  There is no need for concern and it should clear up in a day or so.  SYMPTOMS TO REPORT IMMEDIATELY:  Following lower endoscopy (colonoscopy or flexible sigmoidoscopy):  Excessive amounts of blood in the stool  Significant tenderness or worsening of abdominal pains  Swelling of the abdomen that is new, acute  Fever of 100F or higher  For  urgent or emergent issues, a gastroenterologist can be reached at any hour by calling [phone]. Do not use MyChart messaging for urgent concerns.    DIET:  We do recommend a small meal at first, but then you may proceed to your regular diet.  Drink plenty of fluids but you should avoid alcoholic beverages for 24 hours.  ACTIVITY:  You should plan to take it easy for the rest of today and you should NOT DRIVE or use heavy machinery until tomorrow (because of the sedation medicines used during the test).    FOLLOW UP: Our staff will call the number listed on your records the next business day following your procedure.  We will call around 7:15- 8:00 am to check on you and address any questions or concerns that you may have regarding the information given to you following your procedure. If we do not reach you, we will leave a message.     If any biopsies were taken you will be contacted by phone or by letter within the next 1-3 weeks.  Please call us at [phone] if you have not heard about the biopsies in 3 weeks.    SIGNATURES/CONFIDENTIALITY: You and/or your care partner have signed paperwork which will be entered into your electronic medical record.  These signatures attest to the fact that that the information above on your After Visit Summary has been reviewed and is understood.  Full responsibility of the confidentiality of this discharge information lies with you  and/or your care-partner.

## 2024-01-24 ENCOUNTER — Telehealth: Payer: Self-pay

## 2024-01-24 NOTE — Telephone Encounter (Signed)
  Follow up Call-     01/23/2024    7:42 AM  Call back number  Post procedure Call Back phone  # [phone]  Permission to leave phone message Yes     Patient questions:  Do you have a fever, pain , or abdominal swelling? No. Pain Score  0 *  Have you tolerated food without any problems? Yes.    Have you been able to return to your normal activities? Yes.    Do you have any questions about your discharge instructions: Diet   No. Medications  No. Follow up visit  No.  Do you have questions or concerns about your Care? No.  Actions: * If pain score is 4 or above: No action needed, pain <4.

## 2024-02-13 ENCOUNTER — Telehealth: Payer: Self-pay

## 2024-02-13 NOTE — Telephone Encounter (Addendum)
 Appt made with Dr Avanell Bob on 03/02/24.

## 2024-02-13 NOTE — Telephone Encounter (Signed)
 Pt needs OV with Dr Avanell Bob for permanent Afib... to call his wife Kathaleen Pale at [phone].

## 2024-02-19 ENCOUNTER — Ambulatory Visit: Admitting: Internal Medicine

## 2024-03-01 NOTE — Progress Notes (Unsigned)
 Cardiology Office Note   Date:  03/02/2024   ID:  Aaron Morales, DOB January 25, 1956, MRN [id number]  PCP:  Roxene Cora, PA-C  Cardiologist:   Ola Berger, MD   Pt presents for evaluation of DOE    History of Present Illness: Aaron Morales is a 67 y.o. male with a history of atrial fibrillation, diverticulosis,  CVA (2010), HL, HTN, OSA   The pt was previously followed by [facility] in [place]   Then seen by M Croituru in 2018 He has a hx of atrial fibrillation   Dx in 2010   He has been on rate control and anticoagulation    He had a myoview in 2014      Also had a sleep study in 2010 (reported negative)  The pt is active during day walking  about 40981 steps   Notes no change in ability to do this  In April he was hiking at [facility] it was warm, in 70s   Had walked about 5 miles when he found he couldn't catch his breath  Had not had before   Eased on own  No chest pressure   Has not had any since  He denies palpitaitons   Current Meds  Medication Sig   metoprolol (LOPRESSOR) 50 MG tablet Take 50 mg by mouth 2 (two) times daily.   rivaroxaban  (XARELTO ) 20 MG TABS tablet Take 1 tablet (20 mg total) by mouth daily with supper. To replace Pradaxa .     Allergies:   Patient has no known allergies.   Past Medical History:  Diagnosis Date   Carotid artery stenosis 08/03/2009   internal carotid artery stenosis on the right and no significant disease on the left as well as bilateral antegrade vertebral artery flow,   CVA (cerebral vascular accident) (HCC) 08/02/2009   acute 2D Echo performed show no evidence of an internall cardiac thrombosis, atrial septal defect, or a significant aortic and mitral valve disease   Dyslipidemia    Hx of adenomatous polyp of colon 07/19/2015   Hypertension    OSA (obstructive sleep apnea) 09/01/09   sleep study performed did not reveal evidence of sleep apnea, REM was at 2.8hr, AHI  (5h79min) was 0.5/hr,    Paroxysmal  atrial fibrillation (HCC)    Stroke (HCC) 07/2009   left frontal lobe stroke    Past Surgical History:  Procedure Laterality Date   ACHILLES TENDON REPAIR Left    COLONOSCOPY  2005   GI bleeding   TONSILLECTOMY       Social History:  The patient  reports that he quit smoking about 37 years ago. His smoking use included cigarettes. He has never used smokeless tobacco. He reports current alcohol use. He reports that he does not use drugs.   Family History:  The patient's family history includes Aortic aneurysm in his paternal aunt; CVA in his maternal grandfather; Colon cancer in his maternal aunt; Lung cancer in his father; Transient ischemic attack in his mother.    ROS:  Please see the history of present illness. All other systems are reviewed and  Negative to the above problem except as noted.    PHYSICAL EXAM: VS:  BP (!) 130/90   Pulse 77   Ht 5\' 6"  (1.676 m)   Wt 219 lb 12.8 oz (99.7 kg)   SpO2 98%   BMI 35.48 kg/m   GEN: Well nourished, well developed, in no acute distress  HEENT: normal  Neck: no JVD, carotid bruit Cardiac: Irreg irreg   S1, S2  No S3  No murmurs   No LE  edema  Respiratory:  clear to auscultation  GI: soft, nontender, no masses  No hepatomegaly  MS: no deformity Moving all extremities    EKG:  EKG is ordered today.  Atrial fibrillation 77 bpm    Lipid Panel    Component Value Date/Time   CHOL  08/03/2009 0500    150        ATP III CLASSIFICATION:  <200     mg/dL   Desirable  161-096  mg/dL   Borderline High  >=045    mg/dL   High          TRIG 409 08/03/2009 0500   HDL 50 08/03/2009 0500   CHOLHDL 3.0 08/03/2009 0500   VLDL 24 08/03/2009 0500   LDLCALC  08/03/2009 0500    76        Total Cholesterol/HDL:CHD Risk Coronary Heart Disease Risk Table                     Men   Women  1/2 Average Risk   3.4   3.3  Average Risk       5.0   4.4  2 X Average Risk   9.6   7.1  3 X Average Risk  23.4   11.0        Use the calculated  Patient Ratio above and the CHD Risk Table to determine the patient's CHD Risk.        ATP III CLASSIFICATION (LDL):  <100     mg/dL   Optimal  811-914  mg/dL   Near or Above                    Optimal  130-159  mg/dL   Borderline  782-956  mg/dL   High  >213     mg/dL   Very High      Wt Readings from Last 3 Encounters:  03/02/24 219 lb 12.8 oz (99.7 kg)  01/23/24 229 lb (103.9 kg)  12/17/23 229 lb (103.9 kg)      ASSESSMENT AND PLAN:  1  DOE  Pt had one spell while hiking    None since   No CP      I would first set up for TTE to confirm LV function normal. Results to guide further testing    Activity as tolerated for now   2  Atrial fibrillation    Pt with long hx   On rate control and anticoagluation.    Rate good today    Echo as noted above to evaluate LA/RA and LV     3  Hx CVA   No records to review   Presumed embolic  He says he was told he had carotid plaquing at that time    CT does not comment on that   Will get carotid USN   This will also be helpful in regard to further cardiac work up  4  LIpids   LDL 111  HDL 62  Trig 99   ( March 0865)   Goals to be defined by testing    Reviewed diet some   5  Metabolic   A1C in 2018 was 5.5   Current medicines are reviewed at length with the patient today.  The patient does not have concerns regarding medicines.  Signed, Ola Berger, MD  03/02/2024 9:43 PM    [facility] [address] [place], [place], [place]  [address] Phone: [phone]; Fax: [phone]

## 2024-03-02 ENCOUNTER — Ambulatory Visit: Admitting: Internal Medicine

## 2024-03-02 ENCOUNTER — Ambulatory Visit: Attending: Internal Medicine | Admitting: Internal Medicine

## 2024-03-02 ENCOUNTER — Encounter: Payer: Self-pay | Admitting: Internal Medicine

## 2024-03-02 VITALS — BP 130/90 | HR 77 | Ht 66.0 in | Wt 219.8 lb

## 2024-03-02 DIAGNOSIS — Z8673 Personal history of transient ischemic attack (TIA), and cerebral infarction without residual deficits: Secondary | ICD-10-CM

## 2024-03-02 DIAGNOSIS — I4811 Longstanding persistent atrial fibrillation: Secondary | ICD-10-CM | POA: Diagnosis not present

## 2024-03-02 DIAGNOSIS — Z7901 Long term (current) use of anticoagulants: Secondary | ICD-10-CM

## 2024-03-02 NOTE — Patient Instructions (Signed)
 Medication Instructions:   *If you need a refill on your cardiac medications before your next appointment, please call your pharmacy*  Lab Work:  If you have labs (blood work) drawn today and your tests are completely normal, you will receive your results only by: MyChart Message (if you have MyChart) OR A paper copy in the mail If you have any lab test that is abnormal or we need to change your treatment, we will call you to review the results.  Testing/Procedures: Your physician has requested that you have an echocardiogram. Echocardiography is a painless test that uses sound waves to create images of your heart. It provides your doctor with information about the size and shape of your heart and how well your heart's chambers and valves are working. This procedure takes approximately one hour. There are no restrictions for this procedure. Please do NOT wear cologne, perfume, aftershave, or lotions (deodorant is allowed). Please arrive 15 minutes prior to your appointment time.  Please note: We ask at that you not bring children with you during ultrasound (echo/ vascular) testing. Due to room size and safety concerns, children are not allowed in the ultrasound rooms during exams. Our front office staff cannot provide observation of children in our lobby area while testing is being conducted. An adult accompanying a patient to their appointment will only be allowed in the ultrasound room at the discretion of the ultrasound technician under special circumstances. We apologize for any inconvenience.  Your physician has requested that you have a carotid duplex. This test is an ultrasound of the carotid arteries in your neck. It looks at blood flow through these arteries that supply the brain with blood. Allow one hour for this exam. There are no restrictions or special instructions.  At [facility], you and your health needs are our priority.  As part of our continuing mission to provide  you with exceptional heart care, our providers are all part of one team.  This team includes your primary Cardiologist (physician) and Advanced Practice Providers or APPs (Physician Assistants and Nurse Practitioners) who all work together to provide you with the care you need, when you need it.  Your next appointment:  We recommend signing up for the patient portal called "MyChart".  Sign up information is provided on this After Visit Summary.  MyChart is used to connect with patients for Virtual Visits (Telemedicine).  Patients are able to view lab/test results, encounter notes, upcoming appointments, etc.  Non-urgent messages can be sent to your provider as well.   To learn more about what you can do with MyChart, go to [email or web address].   WILL PLAN FOLLOW UP AFTER TEST REULTS ARE IN.

## 2024-03-25 DIAGNOSIS — N401 Enlarged prostate with lower urinary tract symptoms: Secondary | ICD-10-CM | POA: Diagnosis not present

## 2024-03-25 DIAGNOSIS — R972 Elevated prostate specific antigen [PSA]: Secondary | ICD-10-CM | POA: Diagnosis not present

## 2024-03-25 DIAGNOSIS — R350 Frequency of micturition: Secondary | ICD-10-CM | POA: Diagnosis not present

## 2024-03-27 ENCOUNTER — Other Ambulatory Visit: Payer: Self-pay | Admitting: Urology

## 2024-03-27 DIAGNOSIS — R972 Elevated prostate specific antigen [PSA]: Secondary | ICD-10-CM

## 2024-03-31 ENCOUNTER — Ambulatory Visit (HOSPITAL_COMMUNITY)
Admission: RE | Admit: 2024-03-31 | Discharge: 2024-03-31 | Disposition: A | Discharge status: Home / Self Care | Source: Ambulatory Visit | Attending: Internal Medicine | Admitting: Internal Medicine

## 2024-03-31 DIAGNOSIS — Z7901 Long term (current) use of anticoagulants: Secondary | ICD-10-CM

## 2024-03-31 DIAGNOSIS — I4811 Longstanding persistent atrial fibrillation: Secondary | ICD-10-CM | POA: Diagnosis not present

## 2024-03-31 DIAGNOSIS — Z8673 Personal history of transient ischemic attack (TIA), and cerebral infarction without residual deficits: Secondary | ICD-10-CM

## 2024-03-31 DIAGNOSIS — R0989 Other specified symptoms and signs involving the circulatory and respiratory systems: Secondary | ICD-10-CM

## 2024-04-01 ENCOUNTER — Ambulatory Visit: Payer: Self-pay | Admitting: Internal Medicine

## 2024-04-02 ENCOUNTER — Encounter (HOSPITAL_COMMUNITY)

## 2024-04-02 ENCOUNTER — Other Ambulatory Visit (HOSPITAL_COMMUNITY)

## 2024-04-07 ENCOUNTER — Other Ambulatory Visit: Payer: Self-pay

## 2024-04-07 DIAGNOSIS — I4811 Longstanding persistent atrial fibrillation: Secondary | ICD-10-CM

## 2024-04-07 DIAGNOSIS — Z13220 Encounter for screening for lipoid disorders: Secondary | ICD-10-CM

## 2024-04-07 DIAGNOSIS — Z8673 Personal history of transient ischemic attack (TIA), and cerebral infarction without residual deficits: Secondary | ICD-10-CM

## 2024-04-07 DIAGNOSIS — I779 Disorder of arteries and arterioles, unspecified: Secondary | ICD-10-CM

## 2024-04-08 DIAGNOSIS — Z8673 Personal history of transient ischemic attack (TIA), and cerebral infarction without residual deficits: Secondary | ICD-10-CM | POA: Diagnosis not present

## 2024-04-08 DIAGNOSIS — I4811 Longstanding persistent atrial fibrillation: Secondary | ICD-10-CM | POA: Diagnosis not present

## 2024-04-08 DIAGNOSIS — I779 Disorder of arteries and arterioles, unspecified: Secondary | ICD-10-CM | POA: Diagnosis not present

## 2024-04-08 DIAGNOSIS — Z13220 Encounter for screening for lipoid disorders: Secondary | ICD-10-CM | POA: Diagnosis not present

## 2024-04-09 ENCOUNTER — Ambulatory Visit: Payer: Self-pay | Admitting: Internal Medicine

## 2024-04-10 LAB — NMR, LIPOPROFILE
Cholesterol, Total: 161 mg/dL (ref 100–199)
HDL Particle Number: 39.7 umol/L (ref >=30.5)
HDL-C: 63 mg/dL (ref >39)
LDL Particle Number: 714 nmol/L (ref <1000)
LDL Size: 20.3 nm — ABNORMAL LOW (ref >20.5)
LDL-C (NIH Calc): 69 mg/dL (ref 0–99)
LP-IR Score: 61 — ABNORMAL HIGH (ref <=45)
Small LDL Particle Number: 535 nmol/L — ABNORMAL HIGH (ref <=527)
Triglycerides: 173 mg/dL — ABNORMAL HIGH (ref 0–149)

## 2024-04-10 LAB — LIPOPROTEIN A (LPA): Lipoprotein (a): <8.4 nmol/L (ref <75.0)

## 2024-04-10 LAB — APOLIPOPROTEIN B: Apolipoprotein B: 61 mg/dL (ref <90)

## 2024-04-16 ENCOUNTER — Ambulatory Visit (HOSPITAL_COMMUNITY)
Admission: RE | Admit: 2024-04-16 | Discharge: 2024-04-16 | Disposition: A | Discharge status: Home / Self Care | Source: Ambulatory Visit | Attending: Internal Medicine | Admitting: Internal Medicine

## 2024-04-16 DIAGNOSIS — Z8673 Personal history of transient ischemic attack (TIA), and cerebral infarction without residual deficits: Secondary | ICD-10-CM | POA: Insufficient documentation

## 2024-04-16 DIAGNOSIS — R600 Localized edema: Secondary | ICD-10-CM | POA: Diagnosis not present

## 2024-04-16 DIAGNOSIS — I4811 Longstanding persistent atrial fibrillation: Secondary | ICD-10-CM

## 2024-04-16 DIAGNOSIS — Z79899 Other long term (current) drug therapy: Secondary | ICD-10-CM

## 2024-04-16 DIAGNOSIS — R0609 Other forms of dyspnea: Secondary | ICD-10-CM

## 2024-04-16 DIAGNOSIS — Z7901 Long term (current) use of anticoagulants: Secondary | ICD-10-CM | POA: Diagnosis not present

## 2024-04-16 DIAGNOSIS — E782 Mixed hyperlipidemia: Secondary | ICD-10-CM

## 2024-04-16 LAB — ECHOCARDIOGRAM COMPLETE
AR max vel: 1.67 cm2
AV Area VTI: 1.7 cm2
AV Area mean vel: 1.53 cm2
AV Mean grad: 4 mmHg
AV Peak grad: 6.6 mmHg
Ao pk vel: 1.28 m/s
Area-P 1/2: 3.91 cm2
S' Lateral: 3.33 cm

## 2024-04-16 MED ORDER — ROSUVASTATIN CALCIUM 5 MG PO TABS: 5.0000 mg | ORAL_TABLET | Freq: Every day | ORAL | 3 refills | Status: AC

## 2024-04-20 DIAGNOSIS — M19072 Primary osteoarthritis, left ankle and foot: Secondary | ICD-10-CM | POA: Diagnosis not present

## 2024-04-20 DIAGNOSIS — M205X2 Other deformities of toe(s) (acquired), left foot: Secondary | ICD-10-CM | POA: Diagnosis not present

## 2024-04-20 DIAGNOSIS — M792 Neuralgia and neuritis, unspecified: Secondary | ICD-10-CM | POA: Diagnosis not present

## 2024-04-27 ENCOUNTER — Other Ambulatory Visit: Payer: Self-pay

## 2024-04-27 DIAGNOSIS — I779 Disorder of arteries and arterioles, unspecified: Secondary | ICD-10-CM

## 2024-04-27 DIAGNOSIS — I4811 Longstanding persistent atrial fibrillation: Secondary | ICD-10-CM

## 2024-04-27 DIAGNOSIS — Z79899 Other long term (current) drug therapy: Secondary | ICD-10-CM

## 2024-04-27 NOTE — Telephone Encounter (Signed)
 I spoke with the pt and we went over his CT instructions and he verbalized understanding.

## 2024-04-30 ENCOUNTER — Encounter (HOSPITAL_COMMUNITY): Payer: Self-pay

## 2024-05-01 DIAGNOSIS — E559 Vitamin D deficiency, unspecified: Secondary | ICD-10-CM | POA: Diagnosis not present

## 2024-05-04 ENCOUNTER — Ambulatory Visit (HOSPITAL_COMMUNITY)
Admission: RE | Admit: 2024-05-04 | Discharge: 2024-05-04 | Disposition: A | Discharge status: Home / Self Care | Source: Ambulatory Visit | Attending: Internal Medicine | Admitting: Internal Medicine

## 2024-05-04 DIAGNOSIS — I4811 Longstanding persistent atrial fibrillation: Secondary | ICD-10-CM | POA: Diagnosis not present

## 2024-05-04 DIAGNOSIS — I779 Disorder of arteries and arterioles, unspecified: Secondary | ICD-10-CM | POA: Insufficient documentation

## 2024-05-04 DIAGNOSIS — Z79899 Other long term (current) drug therapy: Secondary | ICD-10-CM | POA: Diagnosis not present

## 2024-05-04 DIAGNOSIS — N62 Hypertrophy of breast: Secondary | ICD-10-CM | POA: Diagnosis not present

## 2024-05-04 MED ORDER — NITROGLYCERIN 0.4 MG SL SUBL
0.8000 mg | SUBLINGUAL_TABLET | Freq: Once | SUBLINGUAL | Status: AC
Start: 2024-05-04 — End: 2024-05-04
  Administered 2024-05-04: 0.8 mg via SUBLINGUAL

## 2024-05-04 MED ORDER — IOHEXOL 350 MG/ML SOLN
100.0000 mL | Freq: Once | INTRAVENOUS | Status: AC | PRN
  Administered 2024-05-04: 100 mL via INTRAVENOUS

## 2024-05-05 ENCOUNTER — Ambulatory Visit: Payer: Self-pay | Admitting: Internal Medicine

## 2024-05-06 ENCOUNTER — Other Ambulatory Visit: Payer: Self-pay

## 2024-05-06 ENCOUNTER — Ambulatory Visit
Admission: RE | Admit: 2024-05-06 | Discharge: 2024-05-06 | Disposition: A | Discharge status: Home / Self Care | Source: Ambulatory Visit | Attending: Urology | Admitting: Urology

## 2024-05-06 DIAGNOSIS — E782 Mixed hyperlipidemia: Secondary | ICD-10-CM

## 2024-05-06 DIAGNOSIS — Z13220 Encounter for screening for lipoid disorders: Secondary | ICD-10-CM

## 2024-05-06 DIAGNOSIS — N4289 Other specified disorders of prostate: Secondary | ICD-10-CM | POA: Diagnosis not present

## 2024-05-06 DIAGNOSIS — R972 Elevated prostate specific antigen [PSA]: Secondary | ICD-10-CM

## 2024-05-06 DIAGNOSIS — Z79899 Other long term (current) drug therapy: Secondary | ICD-10-CM

## 2024-05-06 MED ORDER — GADOPICLENOL 0.5 MMOL/ML IV SOLN
10.0000 mL | Freq: Once | INTRAVENOUS | Status: AC | PRN
  Administered 2024-05-06: 10 mL via INTRAVENOUS

## 2024-06-16 DIAGNOSIS — R972 Elevated prostate specific antigen [PSA]: Secondary | ICD-10-CM | POA: Diagnosis not present

## 2024-06-17 DIAGNOSIS — R972 Elevated prostate specific antigen [PSA]: Secondary | ICD-10-CM | POA: Diagnosis not present

## 2024-06-24 DIAGNOSIS — C61 Malignant neoplasm of prostate: Secondary | ICD-10-CM | POA: Diagnosis not present

## 2024-06-29 DIAGNOSIS — Z13220 Encounter for screening for lipoid disorders: Secondary | ICD-10-CM | POA: Diagnosis not present

## 2024-06-29 DIAGNOSIS — E782 Mixed hyperlipidemia: Secondary | ICD-10-CM | POA: Diagnosis not present

## 2024-06-29 DIAGNOSIS — Z79899 Other long term (current) drug therapy: Secondary | ICD-10-CM | POA: Diagnosis not present

## 2024-06-30 LAB — NMR, LIPOPROFILE
Cholesterol, Total: 139 mg/dL (ref 100–199)
HDL Particle Number: 47.1 umol/L (ref >=30.5)
HDL-C: 72 mg/dL (ref >39)
LDL Particle Number: 457 nmol/L (ref <1000)
LDL Size: 19.7 nm — ABNORMAL LOW (ref >20.5)
LDL-C (NIH Calc): 45 mg/dL (ref 0–99)
LP-IR Score: 43 (ref <=45)
Small LDL Particle Number: 356 nmol/L (ref <=527)
Triglycerides: 132 mg/dL (ref 0–149)

## 2024-06-30 LAB — APOLIPOPROTEIN B: Apolipoprotein B: 52 mg/dL (ref <90)

## 2024-07-01 ENCOUNTER — Ambulatory Visit: Payer: Self-pay | Admitting: Internal Medicine

## 2024-07-01 ENCOUNTER — Telehealth: Payer: Self-pay | Admitting: Radiation Oncology

## 2024-07-01 NOTE — Telephone Encounter (Signed)
Left message for patient to call back to schedule consult per 9/4 referral.

## 2024-07-03 NOTE — Progress Notes (Signed)
 GU Location of Tumor / Histology: Prostate Ca  If Prostate Cancer, Gleason Score is (4 + 3) and PSA is (4.5 on 06/17/2024)  Aaron Morales presented as referral from Dr. Norleen Seltzer [facility]) elevated PSA.  Biopsies      05/06/2024 Dr. Norleen Seltzer MR Prostate with/without Contrast CLINICAL DATA:  68 year old male with elevated PSA equal 3.5.   IMPRESSION: 1. Lesion in the posteromedial LEFT mid gland peripheral zone is highly concerning for prostate adenocarcinoma. PI-RADS (v2.1): 4. (Dynacad 3D post processing performed). ROI #1. 2. Enlarged nodular transitional zone most consistent with benign prostate hypertrophy. PI-RADS(v2.1): 2    Past/Anticipated interventions by urology, if any: NA  Past/Anticipated interventions by medical oncology, if any:  NA  Weight changes, if any:  no  IPSS:  5 SHIM:  24  Bowel/Bladder complaints, if any:  No  Nausea/Vomiting, if any: No  Pain issues, if any:  0/10  SAFETY ISSUES: Prior radiation?  No Pacemaker/ICD? No Possible current pregnancy? Male Is the patient on methotrexate? No  Current Complaints / other details:  None  30 minutes spent total, including time for meaningful use questions, reviewing medication, as well as spent in face-to-face time in nurse evaluation with the patient.

## 2024-07-06 NOTE — Progress Notes (Signed)
 Radiation Oncology         [phone] ________________________________  Initial Outpatient Consultation  Name: Aaron Morales MRN: [id number]  Date: 07/07/2024  DOB: 11-Oct-1956  RR:Gjrxdnw, Lucie PARAS, PA-C  Wrenn, John, MD   REFERRING PHYSICIAN: Watt Rush, MD  DIAGNOSIS: 68 y.o. gentleman with Stage T1c adenocarcinoma of the prostate with Gleason score of 4+3, and PSA of 4.5.  No diagnosis found.  HISTORY OF PRESENT ILLNESS: Aaron Morales is a 68 y.o. male with a diagnosis of prostate cancer. He was noted to have an elevated PSA of 4 by his primary care physician, Lucie Mace, PA-C. This had shown a slow rise from 3.2 in 12/2021. Accordingly, he was referred for evaluation in urology by Dr. Watt on 03/25/24,  digital rectal examination performed at that time showed no nodules or induration. He underwent a prostate MRI on 05/06/24 showing: lesion in posteromedial left mid gland peripheral zone (PI-RADS 4). A repeat PSA was obtained on 06/16/24 showing a further rise to 4.5. The patient proceeded to MRI fusion biopsy of the prostate on 06/17/24.  The prostate volume measured 54 cc.  Out of 15 core biopsies, 3 were positive.  The maximum Gleason score was 4+3, and this was seen in right apex lateral. Additionally, Gleason 3+4 was seen in left base and left base lateral (small focus). Of note, all three MRI ROI samples were benign.  The patient reviewed the biopsy results with his urologist and he has kindly been referred today for discussion of potential radiation treatment options.   PREVIOUS RADIATION THERAPY: No  PAST MEDICAL HISTORY:  Past Medical History:  Diagnosis Date   Carotid artery stenosis 08/03/2009   internal carotid artery stenosis on the right and no significant disease on the left as well as bilateral antegrade vertebral artery flow,   CVA (cerebral vascular accident) (HCC) 08/02/2009   acute 2D Echo performed show no evidence of an internall cardiac thrombosis,  atrial septal defect, or a significant aortic and mitral valve disease   Dyslipidemia    Hx of adenomatous polyp of colon 07/19/2015   Hypertension    OSA (obstructive sleep apnea) 09/01/09   sleep study performed did not reveal evidence of sleep apnea, REM was at 2.8hr, AHI  (5h67min) was 0.5/hr,    Paroxysmal atrial fibrillation (HCC)    Stroke (HCC) 07/2009   left frontal lobe stroke      PAST SURGICAL HISTORY: Past Surgical History:  Procedure Laterality Date   ACHILLES TENDON REPAIR Left    COLONOSCOPY  2005   GI bleeding   TONSILLECTOMY      FAMILY HISTORY:  Family History  Problem Relation Age of Onset   Transient ischemic attack Mother    CVA Maternal Grandfather        also Aortic Aneurysm   Lung cancer Father        small cell   Aortic aneurysm Paternal Aunt    Colon cancer Maternal Aunt     SOCIAL HISTORY:  Social History   Socioeconomic History   Marital status: Married    Spouse name: Not on file   Number of children: 2   Years of education: Not on file   Highest education level: Not on file  Occupational History   Occupation: Firefighter  Tobacco Use   Smoking status: Former    Current packs/day: 0.00    Types: Cigarettes    Quit date: 01/16/1987    Years since quitting: 37.4   Smokeless  tobacco: Never  Substance and Sexual Activity   Alcohol use: Yes    Alcohol/week: 0.0 standard drinks of alcohol    Comment: 2-3 per day   Drug use: No   Sexual activity: Not on file  Other Topics Concern   Not on file  Social History Narrative   Married , lives with wife. He has 2 daughters.   Financial advisor   2 caffeinated beverages daily.   02/21/2015   Social Drivers of Corporate investment banker Strain: Not on file  Food Insecurity: Not on file  Transportation Needs: Not on file  Physical Activity: Not on file  Stress: Not on file  Social Connections: Not on file  Intimate Partner Violence: Not on file    ALLERGIES: Patient has no  known allergies.  MEDICATIONS:  Current Outpatient Medications  Medication Sig Dispense Refill   metoprolol (LOPRESSOR) 50 MG tablet Take 50 mg by mouth 2 (two) times daily.  3   rivaroxaban  (XARELTO ) 20 MG TABS tablet Take 1 tablet (20 mg total) by mouth daily with supper. To replace Pradaxa . 30 tablet 5   rosuvastatin  (CRESTOR ) 5 MG tablet Take 1 tablet (5 mg total) by mouth daily. 90 tablet 3   No current facility-administered medications for this encounter.    REVIEW OF SYSTEMS:  On review of systems, the patient reports that he is doing well overall. He denies any chest pain, shortness of breath, cough, fevers, chills, night sweats, unintended weight changes. He denies any bowel disturbances, and denies abdominal pain, nausea or vomiting. He denies any new musculoskeletal or joint aches or pains. His IPSS was ***, indicating *** urinary symptoms. His SHIM was ***, indicating he {does not have/has mild/moderate/severe} erectile dysfunction. A complete review of systems is obtained and is otherwise negative.    PHYSICAL EXAM:  Wt Readings from Last 3 Encounters:  03/02/24 219 lb 12.8 oz (99.7 kg)  01/23/24 229 lb (103.9 kg)  12/17/23 229 lb (103.9 kg)   Temp Readings from Last 3 Encounters:  01/23/24 98.4 F (36.9 C)  02/04/18 97.7 F (36.5 C)  07/06/15 97.1 F (36.2 C) (Tympanic)   BP Readings from Last 3 Encounters:  05/04/24 111/70  03/02/24 (!) 130/90  01/23/24 (!) 117/91   Pulse Readings from Last 3 Encounters:  05/04/24 75  03/02/24 77  01/23/24 85    /10  In general this is a well appearing *** male in no acute distress. He's alert and oriented x4 and appropriate throughout the examination. Cardiopulmonary assessment is negative for acute distress, and he exhibits normal effort.     KPS = ***  100 - Normal; no complaints; no evidence of disease. 90   - Able to carry on normal activity; minor signs or symptoms of disease. 80   - Normal activity with effort;  some signs or symptoms of disease. 46   - Cares for self; unable to carry on normal activity or to do active work. 60   - Requires occasional assistance, but is able to care for most of his personal needs. 50   - Requires considerable assistance and frequent medical care. 40   - Disabled; requires special care and assistance. 30   - Severely disabled; hospital admission is indicated although death not imminent. 20   - Very sick; hospital admission necessary; active supportive treatment necessary. 10   - Moribund; fatal processes progressing rapidly. 0     - Dead  Karnofsky DA, Abelmann WH, Craver LS and Burchenal  JH [phone]) The use of the nitrogen mustards in the palliative treatment of carcinoma: with particular reference to bronchogenic carcinoma Cancer 1 634-56  LABORATORY DATA:  Lab Results  Component Value Date   WBC 7.8 08/04/2009   HGB 16.8 08/04/2009   HCT 49.7 08/04/2009   MCV 88.7 08/04/2009   PLT 210 08/04/2009   Lab Results  Component Value Date   NA 139 08/04/2009   K 4.3 08/04/2009   CL 105 08/04/2009   CO2 28 08/04/2009   Lab Results  Component Value Date   ALT 26 08/02/2009   AST 26 08/02/2009   ALKPHOS 62 08/02/2009   BILITOT 0.6 08/02/2009     RADIOGRAPHY: No results found.    IMPRESSION/PLAN: 1. 68 y.o. gentleman with Stage T1c adenocarcinoma of the prostate with Gleason Score of 4+3, and PSA of 4.5. We discussed the patient's workup and outlined the nature of prostate cancer in this setting. The patient's T stage, Gleason's score, and PSA put him into the intermediate risk group. Accordingly, he is eligible for a variety of potential treatment options including brachytherapy, 5.5 weeks of external radiation, or prostatectomy. We discussed the available radiation techniques, and focused on the details and logistics of delivery. We discussed and outlined the risks, benefits, short and long-term effects associated with radiotherapy and compared and contrasted  these with prostatectomy. We discussed the role of SpaceOAR gel in reducing the rectal toxicity associated with radiotherapy. He appears to have a good understanding of his disease and our treatment recommendations which are of curative intent.  He was encouraged to ask questions that were answered to his stated satisfaction.  At the conclusion of our conversation, the patient is interested in moving forward with ***.  We personally spent *** minutes in this encounter including chart review, reviewing radiological studies, meeting face-to-face with the patient, entering orders and completing documentation.    Sabra MICAEL Rusk, PA-C    Donnice Barge, MD  [facility]  Radiation Oncology Direct Dial: [phone]  Fax: [phone] [email or web address]  Skype  [email or web address]   This document serves as a record of services personally performed by Donnice Barge, MD and Sabra Rusk, PA-C. It was created on their behalf by Izetta Neither, a trained medical scribe. The creation of this record is based on the scribe's personal observations and the provider's statements to them. This document has been checked and approved by the attending provider.

## 2024-07-07 ENCOUNTER — Ambulatory Visit
Admission: RE | Admit: 2024-07-07 | Discharge: 2024-07-07 | Disposition: A | Discharge status: Home / Self Care | Source: Ambulatory Visit | Attending: Radiation Oncology | Admitting: Radiation Oncology

## 2024-07-07 ENCOUNTER — Encounter: Payer: Self-pay | Admitting: Radiation Oncology

## 2024-07-07 ENCOUNTER — Ambulatory Visit
Admission: RE | Admit: 2024-07-07 | Discharge: 2024-07-07 | Disposition: A | Discharge status: Home / Self Care | Source: Ambulatory Visit | Attending: Radiation Oncology

## 2024-07-07 ENCOUNTER — Ambulatory Visit: Admitting: Cardiovascular Disease

## 2024-07-07 VITALS — BP 133/100 | HR 73 | Temp 96.9°F | Resp 18 | Ht 66.0 in | Wt 209.2 lb

## 2024-07-07 DIAGNOSIS — I6521 Occlusion and stenosis of right carotid artery: Secondary | ICD-10-CM | POA: Insufficient documentation

## 2024-07-07 DIAGNOSIS — Z7901 Long term (current) use of anticoagulants: Secondary | ICD-10-CM | POA: Insufficient documentation

## 2024-07-07 DIAGNOSIS — Z79899 Other long term (current) drug therapy: Secondary | ICD-10-CM | POA: Insufficient documentation

## 2024-07-07 DIAGNOSIS — E785 Hyperlipidemia, unspecified: Secondary | ICD-10-CM | POA: Insufficient documentation

## 2024-07-07 DIAGNOSIS — I10 Essential (primary) hypertension: Secondary | ICD-10-CM | POA: Insufficient documentation

## 2024-07-07 DIAGNOSIS — C61 Malignant neoplasm of prostate: Secondary | ICD-10-CM | POA: Insufficient documentation

## 2024-07-07 DIAGNOSIS — Z801 Family history of malignant neoplasm of trachea, bronchus and lung: Secondary | ICD-10-CM | POA: Insufficient documentation

## 2024-07-07 DIAGNOSIS — Z800 Family history of malignant neoplasm of digestive organs: Secondary | ICD-10-CM | POA: Diagnosis not present

## 2024-07-07 DIAGNOSIS — G4730 Sleep apnea, unspecified: Secondary | ICD-10-CM | POA: Insufficient documentation

## 2024-07-07 DIAGNOSIS — Z860101 Personal history of adenomatous and serrated colon polyps: Secondary | ICD-10-CM | POA: Insufficient documentation

## 2024-07-07 DIAGNOSIS — Z8673 Personal history of transient ischemic attack (TIA), and cerebral infarction without residual deficits: Secondary | ICD-10-CM | POA: Insufficient documentation

## 2024-07-07 DIAGNOSIS — Z191 Hormone sensitive malignancy status: Secondary | ICD-10-CM | POA: Diagnosis not present

## 2024-07-07 DIAGNOSIS — Z87891 Personal history of nicotine dependence: Secondary | ICD-10-CM | POA: Insufficient documentation

## 2024-07-07 DIAGNOSIS — M19079 Primary osteoarthritis, unspecified ankle and foot: Secondary | ICD-10-CM | POA: Insufficient documentation

## 2024-07-07 DIAGNOSIS — I4891 Unspecified atrial fibrillation: Secondary | ICD-10-CM | POA: Insufficient documentation

## 2024-07-07 HISTORY — DX: Elevated prostate specific antigen (PSA): R97.20

## 2024-07-07 NOTE — Progress Notes (Signed)
 Introduced myself to the patient as the prostate nurse navigator.  No barriers to care identified at this time.  He is here to discuss his radiation treatment options and will also have a surgical consult with Dr. Renda on 10/7.  I gave him my business card and asked him to call me with questions or concerns.  Verbalized understanding.

## 2024-07-28 ENCOUNTER — Ambulatory Visit: Admitting: Radiation Oncology

## 2024-07-28 ENCOUNTER — Ambulatory Visit

## 2024-07-28 DIAGNOSIS — R399 Unspecified symptoms and signs involving the genitourinary system: Secondary | ICD-10-CM | POA: Diagnosis not present

## 2024-07-28 DIAGNOSIS — C61 Malignant neoplasm of prostate: Secondary | ICD-10-CM | POA: Diagnosis not present

## 2024-07-30 ENCOUNTER — Emergency Department (HOSPITAL_COMMUNITY)
Admission: EM | Admit: 2024-07-30 | Discharge: 2024-07-30 | Discharge status: Against Medical Advice | Attending: Emergency Medicine | Admitting: Emergency Medicine

## 2024-07-30 ENCOUNTER — Encounter (HOSPITAL_COMMUNITY): Payer: Self-pay | Admitting: *Deleted

## 2024-07-30 ENCOUNTER — Emergency Department (HOSPITAL_COMMUNITY)

## 2024-07-30 ENCOUNTER — Other Ambulatory Visit: Payer: Self-pay

## 2024-07-30 DIAGNOSIS — M1711 Unilateral primary osteoarthritis, right knee: Secondary | ICD-10-CM | POA: Insufficient documentation

## 2024-07-30 DIAGNOSIS — Z5321 Procedure and treatment not carried out due to patient leaving prior to being seen by health care provider: Secondary | ICD-10-CM | POA: Diagnosis not present

## 2024-07-30 DIAGNOSIS — M25561 Pain in right knee: Secondary | ICD-10-CM | POA: Insufficient documentation

## 2024-07-30 DIAGNOSIS — M25061 Hemarthrosis, right knee: Secondary | ICD-10-CM | POA: Insufficient documentation

## 2024-07-30 DIAGNOSIS — M25461 Effusion, right knee: Secondary | ICD-10-CM | POA: Diagnosis not present

## 2024-07-30 MED ORDER — OXYCODONE-ACETAMINOPHEN 5-325 MG PO TABS
1.0000 | ORAL_TABLET | Freq: Once | ORAL | Status: AC
  Administered 2024-07-30: 1 via ORAL

## 2024-07-30 MED ORDER — OXYCODONE-ACETAMINOPHEN 5-325 MG PO TABS
ORAL_TABLET | ORAL | Status: AC
  Filled 2024-07-30: qty 1

## 2024-07-30 NOTE — Progress Notes (Signed)
 RN left message for follow up regarding treatment decision.

## 2024-07-30 NOTE — ED Notes (Signed)
 Pt decided to leave and go somewhere else due to getting tired of wait.

## 2024-07-30 NOTE — ED Triage Notes (Signed)
 The pt is c/o rt knee pain   for one week earlier today the pain came in the rt knee and he is in acute pain now

## 2024-08-04 DIAGNOSIS — R2240 Localized swelling, mass and lump, unspecified lower limb: Secondary | ICD-10-CM | POA: Insufficient documentation

## 2024-08-04 DIAGNOSIS — M25061 Hemarthrosis, right knee: Secondary | ICD-10-CM | POA: Diagnosis not present

## 2024-08-04 DIAGNOSIS — R2241 Localized swelling, mass and lump, right lower limb: Secondary | ICD-10-CM | POA: Diagnosis not present

## 2024-08-04 DIAGNOSIS — M1711 Unilateral primary osteoarthritis, right knee: Secondary | ICD-10-CM | POA: Diagnosis not present

## 2024-08-04 NOTE — Progress Notes (Signed)
 RN spoke with patient, and patients wife.  Education provided on treatment options.  All questions answered.  Patient would like to take some additional time to review options, however, is leaning towards brachytherapy.  RN encouraged patient to call with any follow up questions or treatment decision.

## 2024-08-10 NOTE — Progress Notes (Unsigned)
 Patient was rad onc consult on 07/07/24 for his stage T1c adenocarcinoma of the prostate with Gleason Score of 4+3, and PSA of 4.5.  Had surgical consult with Dr. Renda on 07/28/2024.  Patient has confirmed he would like to proceed with brachytherapy for treatment.  RN reviewed next steps and will notify MD of treatment decision.  Plan of care in progress.

## 2024-08-11 ENCOUNTER — Telehealth: Payer: Self-pay | Admitting: *Deleted

## 2024-08-11 NOTE — Telephone Encounter (Signed)
 Called patient to ask questions, spoke with patient

## 2024-08-14 DIAGNOSIS — M25561 Pain in right knee: Secondary | ICD-10-CM | POA: Diagnosis not present

## 2024-08-17 DIAGNOSIS — I482 Chronic atrial fibrillation, unspecified: Secondary | ICD-10-CM | POA: Diagnosis not present

## 2024-08-17 DIAGNOSIS — M1711 Unilateral primary osteoarthritis, right knee: Secondary | ICD-10-CM | POA: Diagnosis not present

## 2024-08-17 DIAGNOSIS — S83203D Other tear of unspecified meniscus, current injury, right knee, subsequent encounter: Secondary | ICD-10-CM | POA: Diagnosis not present

## 2024-08-17 DIAGNOSIS — M25061 Hemarthrosis, right knee: Secondary | ICD-10-CM | POA: Diagnosis not present

## 2024-08-17 DIAGNOSIS — Z6833 Body mass index (BMI) 33.0-33.9, adult: Secondary | ICD-10-CM | POA: Diagnosis not present

## 2024-08-17 DIAGNOSIS — Z23 Encounter for immunization: Secondary | ICD-10-CM | POA: Diagnosis not present

## 2024-08-18 ENCOUNTER — Telehealth: Payer: Self-pay | Admitting: Internal Medicine

## 2024-08-18 ENCOUNTER — Telehealth (HOSPITAL_BASED_OUTPATIENT_CLINIC_OR_DEPARTMENT_OTHER): Payer: Self-pay

## 2024-08-18 DIAGNOSIS — M1711 Unilateral primary osteoarthritis, right knee: Secondary | ICD-10-CM | POA: Diagnosis not present

## 2024-08-18 DIAGNOSIS — M25061 Hemarthrosis, right knee: Secondary | ICD-10-CM | POA: Diagnosis not present

## 2024-08-18 NOTE — Telephone Encounter (Signed)
 Pt is having procedure with [facility] on 11/3. Calling to make office aware [facility] should be sending a clearance today.

## 2024-08-18 NOTE — Telephone Encounter (Signed)
   Pre-operative Risk Assessment    Patient Name: Aaron Morales  DOB: Feb 10, 1956 MRN: [id number]   Date of last office visit: 03/02/24 with Dr. Okey Date of next office visit: NA   Request for Surgical Clearance    Procedure:  Right Knee Arthroscopy  Date of Surgery:  Clearance 08/24/24                                 Surgeon:  Dr. Marvin Surgeon's Group or Practice Name:  [facility] Phone number:  [phone] Fax number:  [phone]   Type of Clearance Requested:   - Medical  - Pharmacy:  Hold Rivaroxaban  (Xarelto ) not indicated   Type of Anesthesia:  Spinal   Additional requests/questions:    Bonney Augustin JONETTA Delores   08/18/2024, 11:37 AM

## 2024-08-19 ENCOUNTER — Telehealth: Payer: Self-pay

## 2024-08-19 NOTE — Telephone Encounter (Signed)
 Left message to call back to schedule tele preop appt. Pt will need to be added on today or tomorrow due to procedure is 08/24/24 and blood thinner hold needed.

## 2024-08-19 NOTE — Telephone Encounter (Signed)
 Patient with diagnosis of A Fib on Xarelto  for anticoagulation.    Procedure: Right Knee Arthroscopy  Date of procedure: 08/24/24   CHA2DS2-VASc Score = 4  This indicates a 4.8% annual risk of stroke. The patient's score is based upon: CHF History: 0 HTN History: 0 Diabetes History: 0 Stroke History: 2 Vascular Disease History: 1 Age Score: 1 Gender Score: 0    CrCl 103 ml/min Platelet count 232K  Patient has not had an Afib/aflutter ablation in the last 3 months, DCCV within the last 4 weeks or a watchman implanted in the last 45 days    Per office protocol, patient can hold Xarelto  for 2 days prior to procedure.    **This guidance is not considered finalized until pre-operative APP has relayed final recommendations.**

## 2024-08-19 NOTE — Telephone Encounter (Signed)
   Name: Aaron Morales  DOB: 28-Mar-1956  MRN: [id number]  Primary Cardiologist: Dr. Okey   Preoperative team, please contact this patient and set up a phone call appointment for further preoperative risk assessment. Please obtain consent and complete medication review. Thank you for your help.  I confirm that guidance regarding antiplatelet and oral anticoagulation therapy has been completed and, if necessary, noted below.   Per office protocol, patient can hold Xarelto  for 2 days prior to procedure.      I also confirmed the patient resides in the state of [place] . As per [facility] Medical Board telemedicine laws, the patient must reside in the state in which the provider is licensed.   [name], NP 08/19/2024, 8:50 AM [facility]

## 2024-08-19 NOTE — Telephone Encounter (Signed)
  Patient Consent for Virtual Visit        Aaron Morales has provided verbal consent on 08/19/2024 for a virtual visit (video or telephone).   CONSENT FOR VIRTUAL VISIT FOR:  Aaron Morales  By participating in this virtual visit I agree to the following:  I hereby voluntarily request, consent and authorize [facility] and its employed or contracted physicians, physician assistants, nurse practitioners or other licensed health care professionals (the Practitioner), to provide me with telemedicine health care services (the "Services) as deemed necessary by the treating Practitioner. I acknowledge and consent to receive the Services by the Practitioner via telemedicine. I understand that the telemedicine visit will involve communicating with the Practitioner through live audiovisual communication technology and the disclosure of certain medical information by electronic transmission. I acknowledge that I have been given the opportunity to request an in-person assessment or other available alternative prior to the telemedicine visit and am voluntarily participating in the telemedicine visit.  I understand that I have the right to withhold or withdraw my consent to the use of telemedicine in the course of my care at any time, without affecting my right to future care or treatment, and that the Practitioner or I may terminate the telemedicine visit at any time. I understand that I have the right to inspect all information obtained and/or recorded in the course of the telemedicine visit and may receive copies of available information for a reasonable fee.  I understand that some of the potential risks of receiving the Services via telemedicine include:  Delay or interruption in medical evaluation due to technological equipment failure or disruption; Information transmitted may not be sufficient (e.g. poor resolution of images) to allow for appropriate medical decision making by the  Practitioner; and/or  In rare instances, security protocols could fail, causing a breach of personal health information.  Furthermore, I acknowledge that it is my responsibility to provide information about my medical history, conditions and care that is complete and accurate to the best of my ability. I acknowledge that Practitioner's advice, recommendations, and/or decision may be based on factors not within their control, such as incomplete or inaccurate data provided by me or distortions of diagnostic images or specimens that may result from electronic transmissions. I understand that the practice of medicine is not an exact science and that Practitioner makes no warranties or guarantees regarding treatment outcomes. I acknowledge that a copy of this consent can be made available to me via my patient portal [facility] MyChart), or I can request a printed copy by calling the office of [facility].    I understand that my insurance will be billed for this visit.   I have read or had this consent read to me. I understand the contents of this consent, which adequately explains the benefits and risks of the Services being provided via telemedicine.  I have been provided ample opportunity to ask questions regarding this consent and the Services and have had my questions answered to my satisfaction. I give my informed consent for the services to be provided through the use of telemedicine in my medical care

## 2024-08-19 NOTE — Telephone Encounter (Signed)
 Preop tele appt now scheduled, med rec and consent done. Pt added onto next avail slot at this time 10/31.

## 2024-08-19 NOTE — Telephone Encounter (Signed)
 Pt was returning call and is requesting a callback for tele pre op appt. Please advise

## 2024-08-21 ENCOUNTER — Encounter: Payer: Self-pay | Admitting: Nurse Practitioner

## 2024-08-21 ENCOUNTER — Ambulatory Visit: Attending: Cardiovascular Disease | Admitting: Nurse Practitioner

## 2024-08-21 DIAGNOSIS — Z01810 Encounter for preprocedural cardiovascular examination: Secondary | ICD-10-CM

## 2024-08-21 MED ORDER — ENOXAPARIN SODIUM 150 MG/ML IJ SOSY: 150.0000 mg | PREFILLED_SYRINGE | INTRAMUSCULAR | 0 refills | Status: DC

## 2024-08-21 NOTE — Telephone Encounter (Signed)
 Dr Okey recommends Lovenox bridge.  10/31: No Xarelto  tonight  11/1: Inject enoxaparin 150mg  once daily in the morning  11/2: Inject enoxaparin 150mg  once daily in the morning  11/3: Procedure Day - No enoxaparin - Resume Xarelto  in the evening or as directed by surgeon

## 2024-08-21 NOTE — Progress Notes (Signed)
 Virtual Visit via Telephone Note   Because of THERIN VETSCH co-morbid illnesses, he is at least at moderate risk for complications without adequate follow up.  This format is felt to be most appropriate for this patient at this time.  Due to technical limitations with video connection (technology), today's appointment will be conducted as an audio only telehealth visit, and Aaron Morales verbally agreed to proceed in this manner.   All issues noted in this document were discussed and addressed.  No physical exam could be performed with this format.  Evaluation Performed:  Preoperative cardiovascular risk assessment _____________   Date:  08/21/2024   Patient ID:  Aaron Morales, DOB May 22, 1956, MRN [id number] Patient Location:  Home Provider location:   Office  Primary Care Provider:  Leonce Lucie PARAS, PA-C Primary Cardiologist:  None  Chief Complaint / Patient Profile   68 y.o. y/o male with a h/o persistent atrial fibrillation on chronic anticoagulation, coronary calcium  score of 0, no CAD on CCTA 04/2024, CVA in 2010, HTN, mitral regurgitation, hyperlipidemia, OSA who is pending right knee arthroscopy with Dr. Yvone on 08/24/24 and presents today for telephonic preoperative cardiovascular risk assessment.  History of Present Illness    Aaron Morales is a 68 y.o. male who presents via audio/video conferencing for a telehealth visit today.  Pt was last seen in cardiology clinic on 03/02/24 by Dr. Okey.  At that time Aaron Morales had experienced an episode of dyspnea while hiking.  He underwent echocardiogram and coronary CTA which did not reveal any concerning findings. Noted to have moderate MR with no prior echo for comparison. The patient is now pending procedure as outlined above. Since his last visit, he  denies chest pain, shortness of breath, lower extremity edema, fatigue, palpitations, melena, hematuria, hemoptysis, diaphoresis, weakness, presyncope, syncope,  orthopnea, and PND. Patient was active with walking for exercise prior to knee swelling on 07/22/24. He had walked daily for 14 years until 07/21/24 and has been limited by knee pain since that time. He is able to achieve > 4 METS activity without concerning cardiac symptoms.   Past Medical History    Past Medical History:  Diagnosis Date   Carotid artery stenosis 08/03/2009   internal carotid artery stenosis on the right and no significant disease on the left as well as bilateral antegrade vertebral artery flow,   CVA (cerebral vascular accident) (HCC) 08/02/2009   acute 2D Echo performed show no evidence of an internall cardiac thrombosis, atrial septal defect, or a significant aortic and mitral valve disease   Dyslipidemia    Elevated PSA    Hx of adenomatous polyp of colon 07/19/2015   Hypertension    OSA (obstructive sleep apnea) 09/01/2009   sleep study performed did not reveal evidence of sleep apnea, REM was at 2.8hr, AHI  (5h47min) was 0.5/hr,    Paroxysmal atrial fibrillation (HCC)    Stroke (HCC) 07/22/2009   left frontal lobe stroke   Past Surgical History:  Procedure Laterality Date   ACHILLES TENDON REPAIR Left    COLONOSCOPY  10/23/2003   GI bleeding   PROSTATE BIOPSY     TONSILLECTOMY      Allergies  No Known Allergies  Home Medications    Prior to Admission medications   Medication Sig Start Date End Date Taking? Authorizing Provider  metoprolol (LOPRESSOR) 50 MG tablet Take 50 mg by mouth 2 (two) times daily. 10/19/16   [provider]  rivaroxaban  (XARELTO ) 20  MG TABS tablet Take 1 tablet (20 mg total) by mouth daily with supper. To replace Pradaxa . 05/20/17   Croitoru, Jerel, MD  rosuvastatin  (CRESTOR ) 5 MG tablet Take 1 tablet (5 mg total) by mouth daily. 04/16/24   Okey Vina GAILS, MD    Physical Exam    Vital Signs:  Aaron Morales does not have vital signs available for review today.  Given telephonic nature of communication, physical exam is  limited. AAOx3. NAD. Normal affect.  Speech and respirations are unlabored.  Accessory Clinical Findings    None  Assessment & Plan    1.  Preoperative Cardiovascular Risk Assessment: According to the Revised Cardiac Risk Index (RCRI), his Perioperative Risk of Major Cardiac Event is (%): 6.6. His Functional Capacity in METs is: 7.59 according to the Duke Activity Status Index (DASI). The patient is doing well from a cardiac perspective. Therefore, based on ACC/AHA guidelines, the patient would be at acceptable risk for the planned procedure without further cardiovascular testing.   The patient was advised that if he develops new symptoms prior to surgery to contact our office to arrange for a follow-up visit, and he verbalized understanding.  Per office protocol, patient can hold Xarelto  for 2 days prior to procedure and should resume as soon as hemodynamically stable post procedure. Lovenox bridging is recommended and the following schedule was reviewed.  10/31: No Xarelto  tonight   11/1: Inject enoxaparin 150mg  once daily in the morning   11/2: Inject enoxaparin 150mg  once daily in the morning   11/3: Procedure Day - No enoxaparin - Resume Xarelto  in the evening or as directed by surgeon  A copy of this note will be routed to requesting surgeon.  Time:   Today, I have spent 10 minutes with the patient with telehealth technology discussing medical history, symptoms, and management plan.     Rosaline EMERSON Bane, NP-C  08/21/2024, 3:06 PM [address] [place], [place] [address] Office [phone] Fax [phone]

## 2024-08-24 ENCOUNTER — Ambulatory Visit: Admitting: Physician Assistant

## 2024-08-24 ENCOUNTER — Encounter: Payer: Self-pay | Admitting: Physician Assistant

## 2024-08-24 VITALS — BP 130/93

## 2024-08-24 DIAGNOSIS — D1801 Hemangioma of skin and subcutaneous tissue: Secondary | ICD-10-CM

## 2024-08-24 DIAGNOSIS — L821 Other seborrheic keratosis: Secondary | ICD-10-CM

## 2024-08-24 DIAGNOSIS — G8918 Other acute postprocedural pain: Secondary | ICD-10-CM | POA: Diagnosis not present

## 2024-08-24 DIAGNOSIS — Z1283 Encounter for screening for malignant neoplasm of skin: Secondary | ICD-10-CM

## 2024-08-24 DIAGNOSIS — W908XXA Exposure to other nonionizing radiation, initial encounter: Secondary | ICD-10-CM

## 2024-08-24 DIAGNOSIS — M65861 Other synovitis and tenosynovitis, right lower leg: Secondary | ICD-10-CM | POA: Diagnosis not present

## 2024-08-24 DIAGNOSIS — M23251 Derangement of posterior horn of lateral meniscus due to old tear or injury, right knee: Secondary | ICD-10-CM | POA: Diagnosis not present

## 2024-08-24 DIAGNOSIS — L814 Other melanin hyperpigmentation: Secondary | ICD-10-CM | POA: Diagnosis not present

## 2024-08-24 DIAGNOSIS — L578 Other skin changes due to chronic exposure to nonionizing radiation: Secondary | ICD-10-CM

## 2024-08-24 DIAGNOSIS — D229 Melanocytic nevi, unspecified: Secondary | ICD-10-CM

## 2024-08-24 DIAGNOSIS — M94261 Chondromalacia, right knee: Secondary | ICD-10-CM | POA: Diagnosis not present

## 2024-08-24 HISTORY — PX: KNEE ARTHROSCOPY: SUR90

## 2024-08-24 NOTE — Patient Instructions (Signed)

## 2024-08-24 NOTE — Progress Notes (Signed)
   New Patient Visit   Subjective  Aaron Morales is a 68 y.o. male NEW PATIENT who presents for the following: Skin Cancer Screening and Full Body Skin Exam - No history of skin cancer of family history of melanoma.  The patient presents for Total-Body Skin Exam (TBSE) for skin cancer screening and mole check. The patient has spots, moles and lesions to be evaluated, some may be new or changing and the patient may have concern these could be cancer.    The following portions of the chart were reviewed this encounter and updated as appropriate: medications, allergies, medical history  Review of Systems:  No other skin or systemic complaints except as noted in HPI or Assessment and Plan.  Objective  Well appearing patient in no apparent distress; mood and affect are within normal limits.  A full examination was performed including scalp, head, eyes, ears, nose, lips, neck, chest, axillae, abdomen, back, buttocks, bilateral upper extremities, bilateral lower extremities, hands, feet, fingers, toes, fingernails, and toenails. All findings within normal limits unless otherwise noted below.   Relevant physical exam findings are noted in the Assessment and Plan.    Assessment & Plan   SKIN CANCER SCREENING PERFORMED TODAY.  ACTINIC DAMAGE - Chronic condition, secondary to cumulative UV/sun exposure - diffuse scaly erythematous macules with underlying dyspigmentation - Recommend daily broad spectrum sunscreen SPF 30+ to sun-exposed areas, reapply every 2 hours as needed.  - Staying in the shade or wearing long sleeves, sun glasses (UVA+UVB protection) and wide brim hats (4-inch brim around the entire circumference of the hat) are also recommended for sun protection.  - Call for new or changing lesions.  LENTIGINES, SEBORRHEIC KERATOSES, HEMANGIOMAS - Benign normal skin lesions - Benign-appearing - Call for any changes  MELANOCYTIC NEVI - Tan-brown and/or pink-flesh-colored symmetric  macules and papules - Benign appearing on exam today - Observation - Call clinic for new or changing moles - Recommend daily use of broad spectrum spf 30+ sunscreen to sun-exposed areas.    LENTIGINES   SCREENING EXAM FOR SKIN CANCER   SEBORRHEIC KERATOSIS   CHERRY ANGIOMA   ACTINIC SKIN DAMAGE   MULTIPLE BENIGN NEVI    Return in about 1 year (around 08/24/2025) for TBSE.  I, Roseline Hutchinson, CMA, am acting as scribe for Gregary Blackard K, PA-C .   Documentation: I have reviewed the above documentation for accuracy and completeness, and I agree with the above.  Yoland Scherr K, PA-C

## 2024-09-03 ENCOUNTER — Other Ambulatory Visit: Payer: Self-pay | Admitting: Urology

## 2024-09-03 ENCOUNTER — Telehealth: Payer: Self-pay | Admitting: *Deleted

## 2024-09-03 ENCOUNTER — Telehealth: Payer: Self-pay | Admitting: Internal Medicine

## 2024-09-03 DIAGNOSIS — M25061 Hemarthrosis, right knee: Secondary | ICD-10-CM | POA: Diagnosis not present

## 2024-09-03 DIAGNOSIS — Z4889 Encounter for other specified surgical aftercare: Secondary | ICD-10-CM | POA: Diagnosis not present

## 2024-09-03 NOTE — Telephone Encounter (Signed)
Returned patient's phone call, lvm for a return call 

## 2024-09-03 NOTE — Telephone Encounter (Signed)
   Pre-operative Risk Assessment    Patient Name: Aaron Morales  DOB: 08-13-1956 MRN: [id number]   Date of last office visit: 03/02/24 Date of next office visit: Not yet scheduled   Request for Surgical Clearance    Procedure:  Brachytherapy   Date of Surgery:  Clearance 10/20/24                                Surgeon:  Dr. Norleen Seltzer and Dr. Donnice Barge Surgeon's Group or Practice Name:  [facility] Phone number:  [phone]  Fax number:  [phone]   Type of Clearance Requested:   - Medical  - Pharmacy:  Hold Rivaroxaban  (Xarelto )     Type of Anesthesia:  General    Additional requests/questions:  Caller Mar) noted patient will need medical and pharmacy clearance.    Signed, Jasmin B Wilson   09/03/2024, 9:43 AM

## 2024-09-04 ENCOUNTER — Telehealth: Payer: Self-pay | Admitting: *Deleted

## 2024-09-04 NOTE — Telephone Encounter (Signed)
 CALLED PATIENT TO INFORM OF PRE-SEED APPTS. AND IMPLANT DATE, SPOKE WITH PATIENT AND HE IS AWARE OF THESE APPTS.

## 2024-09-04 NOTE — Telephone Encounter (Signed)
CALLED PATIENT TO UPDATE, SPOKE WITH PATIENT 

## 2024-09-16 ENCOUNTER — Telehealth (HOSPITAL_BASED_OUTPATIENT_CLINIC_OR_DEPARTMENT_OTHER): Payer: Self-pay | Admitting: *Deleted

## 2024-09-16 NOTE — Telephone Encounter (Signed)
   Name: Aaron Morales  DOB: [birth year]  MRN: [id number]  Primary Cardiologist: Dr. Okey   Preoperative team, please contact this patient and set up a phone call appointment for further preoperative risk assessment. Please obtain consent and complete medication review. Thank you for your help.  I confirm that guidance regarding antiplatelet and oral anticoagulation therapy has been completed and, if necessary, noted below.  I also confirmed the patient resides in the state of [place] . As per [facility] Medical Board telemedicine laws, the patient must reside in the state in which the provider is licensed.   Jon Nat Hails, PA 09/16/2024, 10:16 AM [facility]

## 2024-09-16 NOTE — Telephone Encounter (Signed)
 Pt has been scheduled tele preop appt 09/28/24. Med rec and consent are done.       Patient Consent for Virtual Visit        Aaron Morales has provided verbal consent on 09/16/2024 for a virtual visit (video or telephone).   CONSENT FOR VIRTUAL VISIT FOR:  Aaron Morales  By participating in this virtual visit I agree to the following:  I hereby voluntarily request, consent and authorize [facility] and its employed or contracted physicians, physician assistants, nurse practitioners or other licensed health care professionals (the Practitioner), to provide me with telemedicine health care services (the "Services) as deemed necessary by the treating Practitioner. I acknowledge and consent to receive the Services by the Practitioner via telemedicine. I understand that the telemedicine visit will involve communicating with the Practitioner through live audiovisual communication technology and the disclosure of certain medical information by electronic transmission. I acknowledge that I have been given the opportunity to request an in-person assessment or other available alternative prior to the telemedicine visit and am voluntarily participating in the telemedicine visit.  I understand that I have the right to withhold or withdraw my consent to the use of telemedicine in the course of my care at any time, without affecting my right to future care or treatment, and that the Practitioner or I may terminate the telemedicine visit at any time. I understand that I have the right to inspect all information obtained and/or recorded in the course of the telemedicine visit and may receive copies of available information for a reasonable fee.  I understand that some of the potential risks of receiving the Services via telemedicine include:  Delay or interruption in medical evaluation due to technological equipment failure or disruption; Information transmitted may not be sufficient (e.g. poor  resolution of images) to allow for appropriate medical decision making by the Practitioner; and/or  In rare instances, security protocols could fail, causing a breach of personal health information.  Furthermore, I acknowledge that it is my responsibility to provide information about my medical history, conditions and care that is complete and accurate to the best of my ability. I acknowledge that Practitioner's advice, recommendations, and/or decision may be based on factors not within their control, such as incomplete or inaccurate data provided by me or distortions of diagnostic images or specimens that may result from electronic transmissions. I understand that the practice of medicine is not an exact science and that Practitioner makes no warranties or guarantees regarding treatment outcomes. I acknowledge that a copy of this consent can be made available to me via my patient portal [facility] MyChart), or I can request a printed copy by calling the office of  [facility].    I understand that my insurance will be billed for this visit.   I have read or had this consent read to me. I understand the contents of this consent, which adequately explains the benefits and risks of the Services being provided via telemedicine.  I have been provided ample opportunity to ask questions regarding this consent and the Services and have had my questions answered to my satisfaction. I give my informed consent for the services to be provided through the use of telemedicine in my medical care

## 2024-09-16 NOTE — Telephone Encounter (Signed)
 Patient with diagnosis of afib on Xarelto  for anticoagulation.    Procedure: Brachytherapy  Date of procedure: 10/20/24   CHA2DS2-VASc Score = 4   This indicates a 4.8% annual risk of stroke. The patient's score is based upon: CHF History: 0 HTN History: 1 Diabetes History: 0 Stroke History: 2 Vascular Disease History: 0 Age Score: 1 Gender Score: 0      CrCl 80 ml/min Platelet count 232  Patient has not had an Afib/aflutter ablation in the last 3 months, DCCV within the last 4 weeks or a watchman implanted in the last 45 days   Per office protocol, patient can hold Xarelto  for 2 days prior to procedure.    The guidelines state that patient's bleed risk, if he were to be bridged, would be higher than his clot risk. In the past patient has been bridged. I feel as though this is not needed, but if patient desires to be bridged and understands bleed risk, then we can provide instructions.  **This guidance is not considered finalized until pre-operative APP has relayed final recommendations.**

## 2024-09-16 NOTE — Telephone Encounter (Signed)
 Pt has been scheduled tele preop appt 11/19/23. Med rec and consent are done.

## 2024-09-21 NOTE — Progress Notes (Signed)
 Pre-seed nursing interview for a diagnosis of 68 y.o. gentleman with Stage T1c adenocarcinoma of the prostate with Gleason score of 4+3, and PSA of 4.5.   Patient identity verified x2.   Patient states issues as follows...  -Pain: *** -Fatigue: *** -Abdomen: *** -Groin: *** -Urinary: *** -Bowels: *** -Appetite: *** -Weight:  Patient denies all other related issues at this time.  Meaningful use complete.  Urinary Management medication(s)-  None Urology appointment date- ***, with Dr. Norleen Seltzer at ***  No vitals needed for this visit.  This concludes the interaction.  NURSE REMINDER: START 'PRE-SEED' EDUCATION VIDEO AT 4:25 mins.

## 2024-09-28 ENCOUNTER — Ambulatory Visit: Attending: Cardiology | Admitting: Nurse Practitioner

## 2024-09-28 DIAGNOSIS — Z01810 Encounter for preprocedural cardiovascular examination: Secondary | ICD-10-CM

## 2024-09-28 DIAGNOSIS — C61 Malignant neoplasm of prostate: Secondary | ICD-10-CM | POA: Diagnosis not present

## 2024-09-28 NOTE — Progress Notes (Signed)
 Virtual Visit via Telephone Note   Because of JAQUAIL MCLEES co-morbid illnesses, he is at least at moderate risk for complications without adequate follow up.  This format is felt to be most appropriate for this patient at this time.  Due to technical limitations with video connection (technology), today's appointment will be conducted as an audio only telehealth visit, and ABDIAS HICKAM verbally agreed to proceed in this manner.   All issues noted in this document were discussed and addressed.  No physical exam could be performed with this format.  Evaluation Performed:  Preoperative cardiovascular risk assessment _____________   Date:  09/28/2024   Patient ID:  Aaron Morales, DOB 1956/05/29, MRN [id number] Patient Location:  Home Provider location:   Office  Primary Care Provider:  Leonce Lucie JINNY DEVONNA Primary Cardiologist:  None  Chief Complaint / Patient Profile   68 y.o. y/o male with a h/o persistent atrial fibrillation on chronic anticoagulation, coronary calcium  score of 0 who is pending brachytherapy on 10/20/24 and presents today for telephonic preoperative cardiovascular risk assessment.  History of Present Illness    Aaron Morales is a 68 y.o. male who presents via audio/video conferencing for a telehealth visit today.  Pt was last seen in cardiology clinic on 03/02/24 by Dr. Okey.  At that time MAX NUNO was doing well.  The patient is now pending procedure as outlined above. Since his last visit, he  denies chest pain, shortness of breath, palpitations, melena, hematuria, hemoptysis, presyncope, syncope, orthopnea, and PND. He underwent knee surgery on 08/24/24 and is recovering well. He is able to walk about 1/4 mile and is gradually regaining full mobility. He is able to achieve > 4 METS activity without concerning cardiac symptoms.   Past Medical History    Past Medical History:  Diagnosis Date   Carotid artery stenosis 08/03/2009   internal carotid  artery stenosis on the right and no significant disease on the left as well as bilateral antegrade vertebral artery flow,   CVA (cerebral vascular accident) (HCC) 08/02/2009   acute 2D Echo performed show no evidence of an internall cardiac thrombosis, atrial septal defect, or a significant aortic and mitral valve disease   Dyslipidemia    Elevated PSA    Hx of adenomatous polyp of colon 07/19/2015   Hypertension    OSA (obstructive sleep apnea) 09/01/2009   sleep study performed did not reveal evidence of sleep apnea, REM was at 2.8hr, AHI  (5h20min) was 0.5/hr,    Paroxysmal atrial fibrillation (HCC)    Stroke (HCC) 07/22/2009   left frontal lobe stroke   Past Surgical History:  Procedure Laterality Date   ACHILLES TENDON REPAIR Left    COLONOSCOPY  10/23/2003   GI bleeding   PROSTATE BIOPSY     TONSILLECTOMY      Allergies  No Known Allergies  Home Medications    Prior to Admission medications   Medication Sig Start Date End Date Taking? Authorizing Provider  enoxaparin  (LOVENOX ) 150 MG/ML injection Inject 1 mL (150 mg total) into the skin daily. 08/21/24   Fizza Scales, Rosaline HERO, NP  metoprolol (LOPRESSOR) 50 MG tablet Take 50 mg by mouth 2 (two) times daily. 10/19/16   [provider]  rivaroxaban  (XARELTO ) 20 MG TABS tablet Take 1 tablet (20 mg total) by mouth daily with supper. To replace Pradaxa . 05/20/17   Croitoru, Mihai, MD  rosuvastatin  (CRESTOR ) 5 MG tablet Take 1 tablet (5 mg total) by mouth daily.  04/16/24   Okey Vina GAILS, MD    Physical Exam    Vital Signs:  Aaron Morales does not have vital signs available for review today.  Given telephonic nature of communication, physical exam is limited. AAOx3. NAD. Normal affect.  Speech and respirations are unlabored.  Accessory Clinical Findings    None  Assessment & Plan    1.  Preoperative Cardiovascular Risk Assessment: According to the Revised Cardiac Risk Index (RCRI), his Perioperative Risk of Major  Cardiac Event is (%): 0.9. His Functional Capacity in METs is: 6.61 according to the Duke Activity Status Index (DASI). The patient is doing well from a cardiac perspective. Therefore, based on ACC/AHA guidelines, the patient would be at acceptable risk for the planned procedure without further cardiovascular testing.   The patient was advised that if he develops new symptoms prior to surgery to contact our office to arrange for a follow-up visit, and he verbalized understanding.  Per office protocol, he may hold Xarelto  for 2 days prior to procedure and should resume as soon as hemodynamically stable postoperatively.   A copy of this note will be routed to requesting surgeon.  Time:   Today, I have spent 10 minutes with the patient with telehealth technology discussing medical history, symptoms, and management plan.     Rosaline EMERSON Bane, NP-C  09/28/2024, 3:18 PM [address], [address] [place], [place] [address] Office [phone] Fax [phone]

## 2024-09-30 ENCOUNTER — Telehealth: Payer: Self-pay | Admitting: *Deleted

## 2024-09-30 DIAGNOSIS — M25661 Stiffness of right knee, not elsewhere classified: Secondary | ICD-10-CM | POA: Diagnosis not present

## 2024-09-30 DIAGNOSIS — M1711 Unilateral primary osteoarthritis, right knee: Secondary | ICD-10-CM | POA: Diagnosis not present

## 2024-09-30 DIAGNOSIS — R262 Difficulty in walking, not elsewhere classified: Secondary | ICD-10-CM | POA: Diagnosis not present

## 2024-09-30 NOTE — Telephone Encounter (Signed)
 CALLED PATIENT TO REMIND OF PRE-SEED APPTS. FOR 10-01-24, SPOKE WITH PATIENT AND HE IS AWARE OF THESE APPTS.

## 2024-10-01 ENCOUNTER — Ambulatory Visit
Admission: RE | Admit: 2024-10-01 | Discharge: 2024-10-01 | Disposition: A | Discharge status: Home / Self Care | Source: Ambulatory Visit | Attending: Radiation Oncology | Admitting: Radiation Oncology

## 2024-10-01 ENCOUNTER — Ambulatory Visit
Admission: RE | Admit: 2024-10-01 | Discharge: 2024-10-01 | Disposition: A | Discharge status: Home / Self Care | Source: Ambulatory Visit | Attending: Urology | Admitting: Urology

## 2024-10-01 ENCOUNTER — Encounter: Payer: Self-pay | Admitting: Urology

## 2024-10-01 DIAGNOSIS — C61 Malignant neoplasm of prostate: Secondary | ICD-10-CM

## 2024-10-01 DIAGNOSIS — Z510 Encounter for antineoplastic radiation therapy: Secondary | ICD-10-CM | POA: Insufficient documentation

## 2024-10-01 DIAGNOSIS — Z191 Hormone sensitive malignancy status: Secondary | ICD-10-CM | POA: Diagnosis not present

## 2024-10-01 NOTE — Progress Notes (Signed)
 Radiation Oncology         (336) [phone] ________________________________  Outpatient Follow up- Pre-seed visit  Name: Aaron Morales MRN: [id number]  Date: 10/01/2024  DOB: January 09, 1956  RR:Gjrxdnw, Lucie PARAS, PA-C  Wrenn, John, MD   REFERRING PHYSICIAN: Watt Rush, MD  DIAGNOSIS: 68 y.o. gentleman with  Stage T1c adenocarcinoma of the prostate with Gleason score of 4+3, and PSA of 4.5.     ICD-10-CM   1. Malignant neoplasm of prostate (HCC)  C61       HISTORY OF PRESENT ILLNESS: Aaron Morales is a 68 y.o. male with a diagnosis of prostate cancer. He was noted to have an elevated PSA of 4 by his primary care provider, Lucie Mace, PA-C. This had shown a slow rise from 3.2 in 12/2021. Accordingly, he was referred for evaluation in urology by Dr. Watt on 03/25/24,  digital rectal examination performed at that time showed no nodules or induration. He underwent a prostate MRI on 05/06/24 showing a PI-RADS 4 lesion in the left posteromedial peripheral zone of the mid gland. A repeat PSA was obtained on 06/16/24 and remained elevated at 4.5. Therefore, the patient proceeded to MRI fusion biopsy of the prostate on 06/17/24.  The prostate volume measured 54 cc.  Out of 15 core biopsies, 3 were positive.  The maximum Gleason score was 4+3, and this was seen in the right apex lateral. Additionally, Gleason 3+4 was seen in the left base and left base lateral (small focus) while all three MRI ROI samples were benign.   The patient reviewed the biopsy results with his urologist and was kindly referred to us  for discussion of potential radiation treatment options. We initially met the patient on 07/07/24 and he was undecided regarding his treatment preference but, after meeting with Dr. Renda to discuss surgical options, he has decided that he is most interested in proceeding with brachytherapy and SpaceOAR gel placement for treatment of his disease. He is here today for his pre-procedure imaging for  planning and to answer any additional questions he may have about this treatment.   PREVIOUS RADIATION THERAPY: No  PAST MEDICAL HISTORY:  Past Medical History:  Diagnosis Date   Carotid artery stenosis 08/03/2009   internal carotid artery stenosis on the right and no significant disease on the left as well as bilateral antegrade vertebral artery flow,   CVA (cerebral vascular accident) (HCC) 08/02/2009   acute 2D Echo performed show no evidence of an internall cardiac thrombosis, atrial septal defect, or a significant aortic and mitral valve disease   Dyslipidemia    Elevated PSA    Hx of adenomatous polyp of colon 07/19/2015   Hypertension    OSA (obstructive sleep apnea) 09/01/2009   sleep study performed did not reveal evidence of sleep apnea, REM was at 2.8hr, AHI  (5h46min) was 0.5/hr,    Paroxysmal atrial fibrillation (HCC)    Stroke (HCC) 07/22/2009   left frontal lobe stroke      PAST SURGICAL HISTORY: Past Surgical History:  Procedure Laterality Date   ACHILLES TENDON REPAIR Left    COLONOSCOPY  10/23/2003   GI bleeding   PROSTATE BIOPSY     TONSILLECTOMY      FAMILY HISTORY:  Family History  Problem Relation Age of Onset   Transient ischemic attack Mother    CVA Maternal Grandfather        also Aortic Aneurysm   Lung cancer Father        small cell  Aortic aneurysm Paternal Aunt    Colon cancer Maternal Aunt     SOCIAL HISTORY:  Social History   Socioeconomic History   Marital status: Married    Spouse name: Not on file   Number of children: 2   Years of education: Not on file   Highest education level: Not on file  Occupational History   Occupation: financial advisor  Tobacco Use   Smoking status: Former    Current packs/day: 0.00    Types: Cigarettes    Quit date: 01/16/1987    Years since quitting: 37.7   Smokeless tobacco: Never  Vaping Use   Vaping status: Never Used  Substance and Sexual Activity   Alcohol use: Yes    Alcohol/week:  0.0 standard drinks of alcohol    Comment: 2-3 per day   Drug use: No   Sexual activity: Yes  Other Topics Concern   Not on file  Social History Narrative   Married , lives with wife. He has 2 daughters.   Financial advisor   2 caffeinated beverages daily.   02/21/2015   Social Drivers of Health   Tobacco Use: Medium Risk (08/24/2024)   Patient History    Smoking Tobacco Use: Former    Smokeless Tobacco Use: Never    Passive Exposure: Not on Actuary Strain: Not on file  Food Insecurity: No Food Insecurity (07/07/2024)   Epic    Worried About Programme Researcher, Broadcasting/film/video in the Last Year: Never true    Ran Out of Food in the Last Year: Never true  Transportation Needs: No Transportation Needs (07/07/2024)   Epic    Lack of Transportation (Medical): No    Lack of Transportation (Non-Medical): No  Physical Activity: Not on file  Stress: Not on file  Social Connections: Not on file  Intimate Partner Violence: Not At Risk (07/07/2024)   Epic    Fear of Current or Ex-Partner: No    Emotionally Abused: No    Physically Abused: No    Sexually Abused: No  Depression (PHQ2-9): Low Risk (07/07/2024)   Depression (PHQ2-9)    PHQ-2 Score: 0  Alcohol Screen: Low Risk (07/07/2024)   Alcohol Screen    Last Alcohol Screening Score (AUDIT): 3  Housing: Low Risk (07/07/2024)   Epic    Unable to Pay for Housing in the Last Year: No    Number of Times Moved in the Last Year: 0    Homeless in the Last Year: No  Utilities: Not At Risk (07/07/2024)   Epic    Threatened with loss of utilities: No  Health Literacy: Not on file    ALLERGIES: Patient has no known allergies.  MEDICATIONS:  Current Outpatient Medications  Medication Sig Dispense Refill   enoxaparin  (LOVENOX ) 150 MG/ML injection Inject 1 mL (150 mg total) into the skin daily. 2 mL 0   metoprolol (LOPRESSOR) 50 MG tablet Take 50 mg by mouth 2 (two) times daily.  3   rivaroxaban  (XARELTO ) 20 MG TABS tablet Take 1 tablet (20  mg total) by mouth daily with supper. To replace Pradaxa . 30 tablet 5   rosuvastatin  (CRESTOR ) 5 MG tablet Take 1 tablet (5 mg total) by mouth daily. 90 tablet 3   No current facility-administered medications for this visit.    REVIEW OF SYSTEMS: On review of systems, the patient reports that he is doing well overall. He denies any chest pain, shortness of breath, cough, fevers, chills, night sweats, unintended weight changes.  He denies any bowel disturbances, and denies abdominal pain, nausea or vomiting. He denies any new musculoskeletal or joint aches or pains. His IPSS was 5, indicating mild urinary symptoms. His SHIM was 24, indicating he does not have erectile dysfunction. A complete review of systems is obtained and is otherwise negative.      PHYSICAL EXAM:  Wt Readings from Last 3 Encounters:  07/30/24 209 lb 3.5 oz (94.9 kg)  07/07/24 209 lb 3.2 oz (94.9 kg)  03/02/24 219 lb 12.8 oz (99.7 kg)   Temp Readings from Last 3 Encounters:  07/30/24 97.7 F (36.5 C)  07/07/24 (!) 96.9 F (36.1 C) (Oral)  01/23/24 98.4 F (36.9 C)   BP Readings from Last 3 Encounters:  08/24/24 (!) 130/93  07/30/24 100/82  07/07/24 (!) 133/100   Pulse Readings from Last 3 Encounters:  07/30/24 83  07/07/24 73  05/04/24 75    /10  In general this is a well appearing Caucasian male in no acute distress. He's alert and oriented x4 and appropriate throughout the examination. Cardiopulmonary assessment is negative for acute distress, and he exhibits normal effort.     KPS = 100  100 - Normal; no complaints; no evidence of disease. 90   - Able to carry on normal activity; minor signs or symptoms of disease. 80   - Normal activity with effort; some signs or symptoms of disease. 52   - Cares for self; unable to carry on normal activity or to do active work. 60   - Requires occasional assistance, but is able to care for most of his personal needs. 50   - Requires considerable assistance and  frequent medical care. 40   - Disabled; requires special care and assistance. 30   - Severely disabled; hospital admission is indicated although death not imminent. 20   - Very sick; hospital admission necessary; active supportive treatment necessary. 10   - Moribund; fatal processes progressing rapidly. 0     - Dead  Karnofsky DA, Abelmann WH, Craver LS and Burchenal [facility] [phone]) The use of the nitrogen mustards in the palliative treatment of carcinoma: with particular reference to bronchogenic carcinoma Cancer 1 634-56  LABORATORY DATA:  Lab Results  Component Value Date   WBC 7.8 08/04/2009   HGB 16.8 08/04/2009   HCT 49.7 08/04/2009   MCV 88.7 08/04/2009   PLT 210 08/04/2009   Lab Results  Component Value Date   NA 139 08/04/2009   K 4.3 08/04/2009   CL 105 08/04/2009   CO2 28 08/04/2009   Lab Results  Component Value Date   ALT 26 08/02/2009   AST 26 08/02/2009   ALKPHOS 62 08/02/2009   BILITOT 0.6 08/02/2009     RADIOGRAPHY: No results found.    IMPRESSION/PLAN: 1. 68 y.o. gentleman with  Stage T1c adenocarcinoma of the prostate with Gleason score of 4+3, and PSA of 4.5.   The patient has elected to proceed with seed implant for treatment of his disease. We reviewed the risks, benefits, short and long-term effects associated with brachytherapy and discussed the role of SpaceOAR in reducing the rectal toxicity associated with radiotherapy.  He appears to have a good understanding of his disease and our treatment recommendations which are of curative intent.  He was encouraged to ask questions that were answered to his stated satisfaction. He has freely signed written consent to proceed today in the office and a copy of this document will be placed in his medical record. His  procedure is tentatively scheduled for 10/20/24 in collaboration with Dr. Watt and we will see him back for his post-procedure visit approximately 3 weeks thereafter. We look forward to continuing to  participate in his care. He knows that he is welcome to call with any questions or concerns at any time in the interim.  I personally spent 30 minutes in this encounter including chart review, reviewing radiological studies, meeting face-to-face with the patient, entering orders and completing documentation.    Sabra MICAEL Rusk, MMS, PA-C [facility] Radiation Oncology Physician Assistant Direct Dial: [phone]  Fax: [phone]

## 2024-10-01 NOTE — Progress Notes (Signed)
°  Radiation Oncology         [phone] ________________________________  Name: Aaron Morales MRN: [id number]  Date: 10/01/2024  DOB: 12/25/1955  SIMULATION AND TREATMENT PLANNING NOTE PUBIC ARCH STUDY Definitive Seed Implant as Monotherapy  RR:Gjrxdnw, Samantha J, PA-C  Wrenn, John, MD  DIAGNOSIS:  68 y.o. gentleman with Stage T1c adenocarcinoma of the prostate with Gleason score of 4+3, and PSA of 4.5.   Oncology History  Malignant neoplasm of prostate (HCC)  06/17/2024 Cancer Staging   Staging form: Prostate, AJCC 8th Edition - Clinical stage from 06/17/2024: Stage IIC (cT1c, cN0, cM0, PSA: 4.5, Grade Group: 3) - Signed by Sherwood Rise, PA-C on 07/07/2024 Histopathologic type: Adenocarcinoma, NOS Stage prefix: Initial diagnosis Prostate specific antigen (PSA) range: Less than 10 Gleason primary pattern: 4 Gleason secondary pattern: 3 Gleason score: 7 Histologic grading system: 5 grade system Number of biopsy cores examined: 15 Number of biopsy cores positive: 3 Location of positive needle core biopsies: Both sides   07/07/2024 Initial Diagnosis   Malignant neoplasm of prostate (HCC)       ICD-10-CM   1. Malignant neoplasm of prostate (HCC)  C61       COMPLEX SIMULATION:  The patient presented today for evaluation for possible prostate seed implant. He was brought to the radiation planning suite and placed supine on the CT couch. A 3-dimensional image study set was obtained in upload to the planning computer. There, on each axial slice, I contoured the prostate gland. Then, using three-dimensional radiation planning tools I reconstructed the prostate in view of the structures from the transperineal needle pathway to assess for possible pubic arch interference. In doing so, I did not appreciate any pubic arch interference. Also, the patient's prostate volume was estimated based on the drawn structure. The volume was 54 cc.  Given the pubic arch appearance and prostate  volume, patient remains a good candidate to proceed with prostate seed implant. Today, he freely provided informed written consent to proceed.    PLAN: The patient will undergo prostate seed implant to 145 Gy.   ________________________________  Donnice LABOR. Patrcia, M.D.

## 2024-10-05 NOTE — Patient Instructions (Addendum)
 SURGICAL WAITING ROOM VISITATION Patients having surgery or a procedure may have no more than 2 support people in the waiting area - these visitors may rotate in the visitor waiting room.   If the patient needs to stay at the hospital during part of their recovery, the visitor guidelines for inpatient rooms apply.  PRE-OP VISITATION  Pre-op nurse will coordinate an appropriate time for 1 support person to accompany the patient in pre-op.  This support person may not rotate.  This visitor will be contacted when the time is appropriate for the visitor to come back in the pre-op area.  To keep our patients, visitors and teammates safe and prevent the spread of respiratory illnesses over the next few months.  Temporary Visitor Restrictions  Children ages 60 and under will not be able to visit patients in [facility] under most circumstances. Visitation is not restricted outside of hospitals unless noted otherwise in the [facility] and Location Specific Visitation Guidelines at:       [email or web address]  Visitors with respiratory illnesses are discouraged from visiting and should remain at home. You are not required to quarantine at this time prior to your surgery. However, you must do this: Hand Hygiene often Do NOT share personal items Notify your provider if you are in close contact with someone who has COVID or you develop fever 100.4 or greater, new onset of sneezing, cough, sore throat, shortness of breath or body aches.  If you test positive for Covid or have been in contact with anyone that has tested positive in the last 10 days please notify you surgeon.    Your procedure is scheduled on:  TUESDAY  10-20-2024  Report to [facility] Main Entrance: [address] entrance where the [employer] is available.   Report to admitting at: 05:15  AM  Call this number if you have any questions or problems the morning of surgery [phone]  DO NOT EAT OR DRINK ANYTHING AFTER  MIDNIGHT THE NIGHT PRIOR TO YOUR SURGERY / PROCEDURE.   FOLLOW  ANY ADDITIONAL PRE OP INSTRUCTIONS YOU RECEIVED FROM YOUR SURGEON'S OFFICE!!!   Oral Hygiene is also important to reduce your risk of infection.        Remember - BRUSH YOUR TEETH THE MORNING OF SURGERY WITH YOUR REGULAR TOOTHPASTE  Do NOT smoke after Midnight the night before surgery.  STOP TAKING all Vitamins, Herbs and supplements 1 week before your surgery.   Take ONLY these medicines the morning of surgery with A SIP OF WATER: Metoprolol   ?Diazepam, ? Hydrocodone APAP                   You may not have any metal on your body including  jewelry, and body piercing  Do not wear , lotions, powders,  cologne, or deodorant  Men may shave face and neck.  Contacts, Hearing Aids, dentures or bridgework may not be worn into surgery. DENTURES WILL BE REMOVED PRIOR TO SURGERY PLEASE DO NOT APPLY Poly grip OR ADHESIVES!!!  Patients discharged on the day of surgery will not be allowed to drive home.  Someone NEEDS to stay with you for the first 24 hours after anesthesia.  Do not bring your home medications to the hospital. The Pharmacy will dispense medications listed on your medication list to you during your admission in the Hospital.  Please read over the following fact sheets you were given: IF YOU HAVE QUESTIONS ABOUT YOUR PRE-OP INSTRUCTIONS, PLEASE CALL [phone].  [place] - Preparing for Surgery      Before surgery, you can play an important role.  Because skin is not sterile, your skin needs to be as free of germs as possible.  You can reduce the number of germs on your skin by washing with CHG (chlorahexidine gluconate) soap before surgery.  CHG is an antiseptic cleaner which kills germs and bonds with the skin to continue killing germs even after washing. Please DO NOT use if you have an allergy to CHG or antibacterial soaps.  If your skin becomes reddened/irritated stop using the CHG and inform your nurse  when you arrive at Short Stay. Do not shave (including legs and underarms) for at least 48 hours prior to the first CHG shower.  You may shave your face/neck.  Please follow these instructions carefully:  1.  Shower with CHG Soap the night before surgery ONLY (DO NOT USE THE CHG SOAP THE MORNING OF SURGERY).  2.  If you choose to wash your hair, wash your hair first as usual with your normal  shampoo.  3.  After you shampoo, rinse your hair and body thoroughly to remove the shampoo.                             4.  Use CHG as you would any other liquid soap.  You can apply chg directly to the skin and wash.  Gently with a scrungie or clean washcloth.  5.  Apply the CHG Soap to your body ONLY FROM THE NECK DOWN.   Do not use on face/ open                           Wound or open sores. Avoid contact with eyes, ears mouth and genitals (private parts).                       Wash face,  Genitals (private parts) with your normal soap.             6.  Wash thoroughly, paying special attention to the area where your  surgery  will be performed.  7.  Thoroughly rinse your body with warm water from the neck down.  8.  DO NOT shower/wash with your normal soap after using and rinsing off the CHG Soap.                9.  Pat yourself dry with a clean towel.            10.  Wear clean pajamas.            11.  Place clean sheets on your bed the night of your first shower and do not  sleep with pets.  Day of Surgery : Do not apply any CHG, lotions/deodorants the morning of surgery.  Please wear clean clothes to the hospital/surgery center.   FAILURE TO FOLLOW THESE INSTRUCTIONS MAY RESULT IN THE CANCELLATION OF YOUR SURGERY  PATIENT SIGNATURE_________________________________  NURSE SIGNATURE__________________________________  ________________________________________________________________________

## 2024-10-05 NOTE — Progress Notes (Signed)
 Lab called Tamika and said this patient's BMP had Hemolyzed, needs to be redrawn on DOS. A Safety Zone was filed on 10-07-24; Tube sat too long in the Lab without processing.   COVID Vaccine received:  []  No [x]  Yes Date of any COVID positive Test in last 90 days: none  PCP - Lucie Mace, PA-C clearance in 08-17-24 note in CE.  Cardiologist -  Vina Gull, MD Rosaline Bane, PA-C , cardiac clearance in 09-28-2024 note  Chest x-ray -  EKG - 03-02-24  Epic  Stress Test - 2010 ECHO - 04-16-2024 Cardiac Cath -  CT Coronary Calcium  score: 0 on 05-04-2024  Pacemaker / ICD device [x]  No []  Yes   Spinal Cord Stimulator:[x]  No []  Yes       History of Sleep Apnea? [x]  No []  Yes  had sleep study CPAP used?- [x]  No []  Yes    Medication on DOS:  Metoprolol   Patient has: [x]  NO Hx DM   []  Pre-DM   []  DM1  []   DM2 Does the patient monitor blood sugar?   [x]  N/A   []  No []  Yes   Blood Thinner / Instructions:  Xarelto  -  Hold x 48 hours Aspirin Instructions:  none  Activity level: Able to walk up 2 flights of stairs without becoming significantly short of breath or having chest pain?   [x]    Yes   []  No,  would have:  Patient can perform ADLs without assistance. [x]   Yes   []  No   Anesthesia review: Chronic A.fib, Hx embolic CVA, HTN, mild OSA- no CPAP  Patient denies any S&S of respiratory illness or Covid - no shortness of breath, fever, cough or chest pain at PAT appointment.  Patient verbalized understanding and agreement to the Pre-Surgical Instructions that were given to them at this PAT appointment. Patient was also educated of the need to review these PAT instructions again prior to his surgery.I reviewed the appropriate phone numbers to call if they have any and questions or concerns.

## 2024-10-07 ENCOUNTER — Encounter (HOSPITAL_COMMUNITY): Admission: RE | Admit: 2024-10-07 | Discharge: 2024-10-07 | Attending: Urology | Admitting: Urology

## 2024-10-07 ENCOUNTER — Other Ambulatory Visit: Payer: Self-pay

## 2024-10-07 ENCOUNTER — Encounter (HOSPITAL_COMMUNITY): Payer: Self-pay

## 2024-10-07 VITALS — BP 118/96 | HR 70 | Temp 98.0°F | Resp 20 | Ht 66.0 in | Wt 205.0 lb

## 2024-10-07 DIAGNOSIS — Z01812 Encounter for preprocedural laboratory examination: Secondary | ICD-10-CM | POA: Insufficient documentation

## 2024-10-07 DIAGNOSIS — Z01818 Encounter for other preprocedural examination: Secondary | ICD-10-CM

## 2024-10-07 DIAGNOSIS — I10 Essential (primary) hypertension: Secondary | ICD-10-CM | POA: Insufficient documentation

## 2024-10-07 DIAGNOSIS — G4733 Obstructive sleep apnea (adult) (pediatric): Secondary | ICD-10-CM | POA: Insufficient documentation

## 2024-10-07 DIAGNOSIS — C61 Malignant neoplasm of prostate: Secondary | ICD-10-CM | POA: Diagnosis not present

## 2024-10-07 DIAGNOSIS — I4819 Other persistent atrial fibrillation: Secondary | ICD-10-CM | POA: Insufficient documentation

## 2024-10-07 DIAGNOSIS — Z7901 Long term (current) use of anticoagulants: Secondary | ICD-10-CM | POA: Insufficient documentation

## 2024-10-07 DIAGNOSIS — Z8673 Personal history of transient ischemic attack (TIA), and cerebral infarction without residual deficits: Secondary | ICD-10-CM | POA: Insufficient documentation

## 2024-10-07 LAB — CBC
HCT: 49.5 % (ref 39.0–52.0)
Hemoglobin: 16.3 g/dL (ref 13.0–17.0)
MCH: 29.9 pg (ref 26.0–34.0)
MCHC: 32.9 g/dL (ref 30.0–36.0)
MCV: 90.7 fL (ref 80.0–100.0)
Platelets: 232 K/uL (ref 150–400)
RBC: 5.46 MIL/uL (ref 4.22–5.81)
RDW: 13.2 % (ref 11.5–15.5)
WBC: 7.8 K/uL (ref 4.0–10.5)
nRBC: 0 % (ref 0.0–0.2)

## 2024-10-08 NOTE — Progress Notes (Signed)
 Pt BMP hemolyzed at PST appt. Will need redrawn on DOS

## 2024-10-08 NOTE — Progress Notes (Signed)
 Anesthesia Chart Review:  68 year old male follows with cardiology for history of HTN, CVA, mild OSA not on CPAP, persistent atrial fibrillation on Xarelto .  Coronary CTA 04/2023 showed calcium  score of 0 with minimal nonobstructive CAD.  Echo 6/25 showed LVEF 55 to 60%, normal RV systolic function, LA severely dilated, moderate mitral regurgitation.  Seen by Rosaline Bane, NP on 09/28/2024 for preop evaluation.  Per note, Preoperative Cardiovascular Risk Assessment: According to the Revised Cardiac Risk Index (RCRI), his Perioperative Risk of Major Cardiac Event is (%): 0.9. His Functional Capacity in METs is: 6.61 according to the Duke Activity Status Index (DASI). The patient is doing well from a cardiac perspective. Therefore, based on ACC/AHA guidelines, the patient would be at acceptable risk for the planned procedure without further cardiovascular testing. The patient was advised that if he develops new symptoms prior to surgery to contact our office to arrange for a follow-up visit, and he verbalized understanding. Per office protocol, he may hold Xarelto  for 2 days prior to procedure and should resume as soon as hemodynamically stable postoperatively.  BMP hemolyzed and will need to be redrawn on day of surgery.  Preop CBC reviewed, WNL.  EKG 03/02/2024: Atrial fibrillation.  Rate 77.  Rightward axis.  Coronary CTA 05/04/2024: IMPRESSION: 1. Coronary calcium  score of 0. This was 0 percentile for age and sex matched control.   2. Normal coronary origin with right dominance.   3. CAD-RADS 1. Minimal non-obstructive CAD (0-24%). Consider non-atherosclerotic causes of chest pain. Consider preventive therapy and risk factor modification.   4. Total plaque volume 66 mm3 which is 16th percentile for age- and sex-matched controls (calcified plaque 0 mm3; non-calcified plaque 66 mm3,Low attenuation 0 mm3).   The noncardiac portion of this study will be interpreted in separate report by the  radiologist.  TTE 04/16/24: 1. Left ventricular ejection fraction, by estimation, is 55 to 60%. The  left ventricle has normal function. The left ventricle has no regional  wall motion abnormalities. Left ventricular diastolic function could not  be evaluated.   2. Right ventricular systolic function is normal. The right ventricular  size is mildly enlarged. Mildly increased right ventricular wall  thickness.   3. Left atrial size was severely dilated.   4. Right atrial size was mildly dilated.   5. The mitral valve is degenerative. Moderate mitral valve regurgitation.  No evidence of mitral stenosis.   6. The aortic valve is tricuspid. Aortic valve regurgitation is trivial.  Aortic valve sclerosis is present, with no evidence of aortic valve  stenosis.   7. Aortic dilatation noted. There is dilatation of the ascending aorta,  measuring 40 mm. There is dilatation of the aortic arch, measuring 39 mm.   8. The inferior vena cava is dilated in size with >50% respiratory  variability, suggesting right atrial pressure of 8 mmHg.     Lynwood Geofm RIGGERS [facility] Short Stay Center/Anesthesiology Phone [phone] 10/08/2024 9:42 AM

## 2024-10-08 NOTE — Anesthesia Preprocedure Evaluation (Addendum)
 "                                  Anesthesia Evaluation  Patient identified by MRN, date of birth, ID band Patient awake    Reviewed: Allergy & Precautions, NPO status , Patient's Chart, lab work & pertinent test results, reviewed documented beta blocker date and time   History of Anesthesia Complications Negative for: history of anesthetic complications  Airway Mallampati: III  TM Distance: >3 FB     Dental no notable dental hx.    Pulmonary sleep apnea , neg COPD, former smoker   breath sounds clear to auscultation       Cardiovascular hypertension, (-) angina (-) CAD and (-) Past MI + dysrhythmias Atrial Fibrillation  Rhythm:Regular Rate:Normal  IMPRESSIONS     1. Left ventricular ejection fraction, by estimation, is 55 to 60%. The  left ventricle has normal function. The left ventricle has no regional  wall motion abnormalities. Left ventricular diastolic function could not  be evaluated.   2. Right ventricular systolic function is normal. The right ventricular  size is mildly enlarged. Mildly increased right ventricular wall  thickness.   3. Left atrial size was severely dilated.   4. Right atrial size was mildly dilated.   5. The mitral valve is degenerative. Moderate mitral valve regurgitation.  No evidence of mitral stenosis.   6. The aortic valve is tricuspid. Aortic valve regurgitation is trivial.  Aortic valve sclerosis is present, with no evidence of aortic valve  stenosis.   7. Aortic dilatation noted. There is dilatation of the ascending aorta,  measuring 40 mm. There is dilatation of the aortic arch, measuring 39 mm.   8. The inferior vena cava is dilated in size with >50% respiratory  variability, suggesting right atrial pressure of 8 mmHg.      Neuro/Psych neg Seizures CVA, No Residual Symptoms    GI/Hepatic ,,,(+) neg Cirrhosis        Endo/Other    Renal/GU Renal disease     Musculoskeletal  (+) Arthritis , Osteoarthritis,     Abdominal   Peds  Hematology   Anesthesia Other Findings   Reproductive/Obstetrics                              Anesthesia Physical Anesthesia Plan  ASA: 3  Anesthesia Plan: General   Post-op Pain Management:    Induction: Intravenous  PONV Risk Score and Plan: 2 and Ondansetron  and Dexamethasone   Airway Management Planned: Oral ETT  Additional Equipment:   Intra-op Plan:   Post-operative Plan: Extubation in OR  Informed Consent: I have reviewed the patients History and Physical, chart, labs and discussed the procedure including the risks, benefits and alternatives for the proposed anesthesia with the patient or authorized representative who has indicated his/her understanding and acceptance.     Dental advisory given  Plan Discussed with: CRNA  Anesthesia Plan Comments: ( PAT note by Lynwood Hope, PA-C: 68 year old male follows with cardiology for history of HTN, CVA, mild OSA not on CPAP, persistent atrial fibrillation on Xarelto .  Coronary CTA 04/2023 showed calcium  score of 0 with minimal nonobstructive CAD.  Echo 6/25 showed LVEF 55 to 60%, normal RV systolic function, LA severely dilated, moderate mitral regurgitation.  Seen by Rosaline Bane, NP on 09/28/2024 for preop evaluation.  Per note, Preoperative Cardiovascular Risk Assessment: According to  the Revised Cardiac Risk Index (RCRI), his Perioperative Risk of Major Cardiac Event is (%): 0.9. His Functional Capacity in METs is: 6.61 according to the Duke Activity Status Index (DASI). The patient is doing well from a cardiac perspective. Therefore, based on ACC/AHA guidelines, the patient would be at acceptable risk for the planned procedure without further cardiovascular testing. The patient was advised that if he develops new symptoms prior to surgery to contact our office to arrange for a follow-up visit, and he verbalized understanding. Per office protocol, he may hold Xarelto  for 2 days  prior to procedure and should resume as soon as hemodynamically stable postoperatively.  BMP hemolyzed and will need to be redrawn on day of surgery.  Preop CBC reviewed, WNL.  EKG 03/02/2024: Atrial fibrillation.  Rate 77.  Rightward axis.  Coronary CTA 05/04/2024: IMPRESSION: 1. Coronary calcium  score of 0. This was 0 percentile for age and sex matched control.  2. Normal coronary origin with right dominance.  3. CAD-RADS 1. Minimal non-obstructive CAD (0-24%). Consider non-atherosclerotic causes of chest pain. Consider preventive therapy and risk factor modification.  4. Total plaque volume 66 mm3 which is 16th percentile for age- and sex-matched controls (calcified plaque 0 mm3; non-calcified plaque 66 mm3,Low attenuation 0 mm3).  The noncardiac portion of this study will be interpreted in separate report by the radiologist.  TTE 04/16/24: 1. Left ventricular ejection fraction, by estimation, is 55 to 60%. The  left ventricle has normal function. The left ventricle has no regional  wall motion abnormalities. Left ventricular diastolic function could not  be evaluated.  2. Right ventricular systolic function is normal. The right ventricular  size is mildly enlarged. Mildly increased right ventricular wall  thickness.  3. Left atrial size was severely dilated.  4. Right atrial size was mildly dilated.  5. The mitral valve is degenerative. Moderate mitral valve regurgitation.  No evidence of mitral stenosis.  6. The aortic valve is tricuspid. Aortic valve regurgitation is trivial.  Aortic valve sclerosis is present, with no evidence of aortic valve  stenosis.  7. Aortic dilatation noted. There is dilatation of the ascending aorta,  measuring 40 mm. There is dilatation of the aortic arch, measuring 39 mm.  8. The inferior vena cava is dilated in size with >50% respiratory  variability, suggesting right atrial pressure of 8 mmHg.      )          Anesthesia Quick Evaluation  "

## 2024-10-14 NOTE — Progress Notes (Addendum)
 Patient's wife just called and today (10-14-24) she tested positive for COVID. Her husband's Prostate Seed / Space OAR is scheduled for 10-20-24, he has not been tested and has had no symptoms. I gave her Dr. Maynard office number and she will call Shona Riedel about this.

## 2024-10-19 ENCOUNTER — Telehealth: Payer: Self-pay | Admitting: *Deleted

## 2024-10-19 NOTE — Telephone Encounter (Signed)
 Called patient to remind of procedure for 10-20-24 spoke with patient and he is aware of this procedure

## 2024-10-20 ENCOUNTER — Encounter (HOSPITAL_COMMUNITY)
Admission: RE | Disposition: A | Discharge status: Home / Self Care | Payer: Self-pay | Source: Ambulatory Visit | Attending: Urology

## 2024-10-20 ENCOUNTER — Encounter (HOSPITAL_COMMUNITY): Payer: Self-pay | Admitting: Urology

## 2024-10-20 ENCOUNTER — Other Ambulatory Visit: Payer: Self-pay | Admitting: Urology

## 2024-10-20 ENCOUNTER — Ambulatory Visit (HOSPITAL_COMMUNITY): Payer: Self-pay | Admitting: Medical

## 2024-10-20 ENCOUNTER — Ambulatory Visit (HOSPITAL_COMMUNITY)
Admission: RE | Admit: 2024-10-20 | Discharge: 2024-10-20 | Disposition: A | Discharge status: Home / Self Care | Source: Ambulatory Visit | Attending: Urology | Admitting: Urology

## 2024-10-20 ENCOUNTER — Ambulatory Visit (HOSPITAL_COMMUNITY)

## 2024-10-20 ENCOUNTER — Other Ambulatory Visit: Payer: Self-pay

## 2024-10-20 ENCOUNTER — Ambulatory Visit (HOSPITAL_COMMUNITY): Payer: Self-pay

## 2024-10-20 DIAGNOSIS — C61 Malignant neoplasm of prostate: Secondary | ICD-10-CM

## 2024-10-20 DIAGNOSIS — Z87891 Personal history of nicotine dependence: Secondary | ICD-10-CM | POA: Diagnosis not present

## 2024-10-20 DIAGNOSIS — I10 Essential (primary) hypertension: Secondary | ICD-10-CM | POA: Insufficient documentation

## 2024-10-20 DIAGNOSIS — Z01818 Encounter for other preprocedural examination: Secondary | ICD-10-CM

## 2024-10-20 DIAGNOSIS — Z7901 Long term (current) use of anticoagulants: Secondary | ICD-10-CM | POA: Diagnosis not present

## 2024-10-20 DIAGNOSIS — G4730 Sleep apnea, unspecified: Secondary | ICD-10-CM | POA: Insufficient documentation

## 2024-10-20 DIAGNOSIS — I4891 Unspecified atrial fibrillation: Secondary | ICD-10-CM

## 2024-10-20 DIAGNOSIS — Z8673 Personal history of transient ischemic attack (TIA), and cerebral infarction without residual deficits: Secondary | ICD-10-CM | POA: Diagnosis not present

## 2024-10-20 HISTORY — PX: SPACE OAR INSTILLATION: SHX6769

## 2024-10-20 HISTORY — PX: RADIOACTIVE SEED IMPLANT: SHX5150

## 2024-10-20 LAB — BASIC METABOLIC PANEL WITH GFR
Anion gap: 11 (ref 5–15)
BUN: 11 mg/dL (ref 8–23)
CO2: 25 mmol/L (ref 22–32)
Calcium: 9.3 mg/dL (ref 8.9–10.3)
Chloride: 103 mmol/L (ref 98–111)
Creatinine, Ser: 0.89 mg/dL (ref 0.61–1.24)
GFR, Estimated: >60 mL/min
Glucose, Bld: 123 mg/dL — ABNORMAL HIGH (ref 70–99)
Potassium: 4 mmol/L (ref 3.5–5.1)
Sodium: 139 mmol/L (ref 135–145)

## 2024-10-20 SURGERY — INSERTION, RADIATION SOURCE, PROSTATE
Anesthesia: General | Site: Prostate

## 2024-10-20 MED ORDER — MORPHINE SULFATE (PF) 2 MG/ML IV SOLN: 2.0000 mg | INTRAVENOUS | Status: DC | PRN

## 2024-10-20 MED ORDER — OXYCODONE HCL 5 MG PO TABS: 5.0000 mg | ORAL_TABLET | ORAL | Status: DC | PRN

## 2024-10-20 MED ORDER — ACETAMINOPHEN 650 MG RE SUPP: 650.0000 mg | RECTAL | Status: DC | PRN

## 2024-10-20 MED ORDER — FENTANYL CITRATE (PF) 100 MCG/2ML IJ SOLN
INTRAMUSCULAR | Status: DC | PRN
  Administered 2024-10-20: 100 ug via INTRAVENOUS

## 2024-10-20 MED ORDER — SODIUM CHLORIDE 0.9% FLUSH: 3.0000 mL | Freq: Two times a day (BID) | INTRAVENOUS | Status: DC

## 2024-10-20 MED ORDER — CIPROFLOXACIN IN D5W 400 MG/200ML IV SOLN
400.0000 mg | INTRAVENOUS | Status: AC
  Administered 2024-10-20: 400 mg via INTRAVENOUS
  Filled 2024-10-20: qty 200

## 2024-10-20 MED ORDER — SODIUM CHLORIDE (PF) 0.9 % IJ SOLN
INTRAMUSCULAR | Status: AC
  Filled 2024-10-20: qty 10

## 2024-10-20 MED ORDER — FENTANYL CITRATE (PF) 100 MCG/2ML IJ SOLN
INTRAMUSCULAR | Status: AC
  Filled 2024-10-20: qty 2

## 2024-10-20 MED ORDER — ORAL CARE MOUTH RINSE: 15.0000 mL | Freq: Once | OROMUCOSAL | Status: AC

## 2024-10-20 MED ORDER — PHENYLEPHRINE HCL-NACL 20-0.9 MG/250ML-% IV SOLN
INTRAVENOUS | Status: DC | PRN
  Administered 2024-10-20: 30 ug/min via INTRAVENOUS

## 2024-10-20 MED ORDER — ACETAMINOPHEN 325 MG PO TABS: 650.0000 mg | ORAL_TABLET | ORAL | Status: DC | PRN

## 2024-10-20 MED ORDER — FLEET ENEMA RE ENEM: 1.0000 | ENEMA | Freq: Once | RECTAL | Status: DC

## 2024-10-20 MED ORDER — MIDAZOLAM HCL 2 MG/2ML IJ SOLN
INTRAMUSCULAR | Status: AC
  Filled 2024-10-20: qty 2

## 2024-10-20 MED ORDER — OXYCODONE HCL 5 MG/5ML PO SOLN: 5.0000 mg | Freq: Once | ORAL | Status: DC | PRN

## 2024-10-20 MED ORDER — PHENYLEPHRINE 80 MCG/ML (10ML) SYRINGE FOR IV PUSH (FOR BLOOD PRESSURE SUPPORT)
PREFILLED_SYRINGE | INTRAVENOUS | Status: AC
  Filled 2024-10-20: qty 10

## 2024-10-20 MED ORDER — SODIUM CHLORIDE (PF) 0.9 % IJ SOLN
INTRAMUSCULAR | Status: AC
  Filled 2024-10-20: qty 50

## 2024-10-20 MED ORDER — ACETAMINOPHEN 10 MG/ML IV SOLN: 1000.0000 mg | Freq: Once | INTRAVENOUS | Status: DC | PRN

## 2024-10-20 MED ORDER — PROPOFOL 10 MG/ML IV BOLUS
INTRAVENOUS | Status: AC
  Filled 2024-10-20: qty 20

## 2024-10-20 MED ORDER — LORAZEPAM 1 MG PO TABS: 1.0000 mg | ORAL_TABLET | ORAL | 0 refills | Status: AC

## 2024-10-20 MED ORDER — LACTATED RINGERS IV SOLN: INTRAVENOUS | Status: DC

## 2024-10-20 MED ORDER — ONDANSETRON HCL 4 MG/2ML IJ SOLN
INTRAMUSCULAR | Status: AC
  Filled 2024-10-20: qty 2

## 2024-10-20 MED ORDER — ROCURONIUM BROMIDE 100 MG/10ML IV SOLN
INTRAVENOUS | Status: DC | PRN
  Administered 2024-10-20: 60 mg via INTRAVENOUS
  Administered 2024-10-20: 10 mg via INTRAVENOUS

## 2024-10-20 MED ORDER — STERILE WATER FOR IRRIGATION IR SOLN
Status: DC | PRN
  Administered 2024-10-20: 1000 mL

## 2024-10-20 MED ORDER — ROCURONIUM BROMIDE 10 MG/ML (PF) SYRINGE
PREFILLED_SYRINGE | INTRAVENOUS | Status: AC
  Filled 2024-10-20: qty 10

## 2024-10-20 MED ORDER — PHENYLEPHRINE 80 MCG/ML (10ML) SYRINGE FOR IV PUSH (FOR BLOOD PRESSURE SUPPORT)
PREFILLED_SYRINGE | INTRAVENOUS | Status: DC | PRN
  Administered 2024-10-20 (×2): 80 ug via INTRAVENOUS
  Administered 2024-10-20: 40 ug via INTRAVENOUS
  Administered 2024-10-20: 80 ug via INTRAVENOUS

## 2024-10-20 MED ORDER — OXYCODONE HCL 5 MG PO TABS: 5.0000 mg | ORAL_TABLET | Freq: Once | ORAL | Status: DC | PRN

## 2024-10-20 MED ORDER — DEXAMETHASONE SOD PHOSPHATE PF 10 MG/ML IJ SOLN
INTRAMUSCULAR | Status: DC | PRN
  Administered 2024-10-20: 5 mg via INTRAVENOUS

## 2024-10-20 MED ORDER — MIDAZOLAM HCL 5 MG/5ML IJ SOLN
INTRAMUSCULAR | Status: DC | PRN
  Administered 2024-10-20: 1 mg via INTRAVENOUS

## 2024-10-20 MED ORDER — SODIUM CHLORIDE 0.9% FLUSH: 3.0000 mL | INTRAVENOUS | Status: DC | PRN

## 2024-10-20 MED ORDER — LIDOCAINE HCL (PF) 2 % IJ SOLN
INTRAMUSCULAR | Status: AC
  Filled 2024-10-20: qty 5

## 2024-10-20 MED ORDER — SUGAMMADEX SODIUM 200 MG/2ML IV SOLN
INTRAVENOUS | Status: DC | PRN
  Administered 2024-10-20: 200 mg via INTRAVENOUS

## 2024-10-20 MED ORDER — SODIUM CHLORIDE 0.9 % IV SOLN: 250.0000 mL | INTRAVENOUS | Status: DC | PRN

## 2024-10-20 MED ORDER — IOHEXOL 300 MG/ML  SOLN
INTRAMUSCULAR | Status: DC | PRN
  Administered 2024-10-20: 7 mL

## 2024-10-20 MED ORDER — SODIUM CHLORIDE FLUSH 0.9 % IV SOLN
INTRAVENOUS | Status: DC | PRN
  Administered 2024-10-20: 50 mL via INTRAVENOUS

## 2024-10-20 MED ORDER — HYDROCODONE-ACETAMINOPHEN 5-325 MG PO TABS: 1.0000 | ORAL_TABLET | Freq: Four times a day (QID) | ORAL | 0 refills | Status: AC | PRN

## 2024-10-20 MED ORDER — SUGAMMADEX SODIUM 200 MG/2ML IV SOLN
INTRAVENOUS | Status: AC
  Filled 2024-10-20: qty 2

## 2024-10-20 MED ORDER — FENTANYL CITRATE (PF) 50 MCG/ML IJ SOSY: 25.0000 ug | PREFILLED_SYRINGE | INTRAMUSCULAR | Status: DC | PRN

## 2024-10-20 MED ORDER — LIDOCAINE HCL (CARDIAC) PF 100 MG/5ML IV SOSY
PREFILLED_SYRINGE | INTRAVENOUS | Status: DC | PRN
  Administered 2024-10-20: 100 mg via INTRAVENOUS

## 2024-10-20 MED ORDER — CHLORHEXIDINE GLUCONATE 0.12 % MT SOLN
15.0000 mL | Freq: Once | OROMUCOSAL | Status: AC
  Administered 2024-10-20: 15 mL via OROMUCOSAL

## 2024-10-20 MED ORDER — PROPOFOL 10 MG/ML IV BOLUS
INTRAVENOUS | Status: DC | PRN
  Administered 2024-10-20: 150 mg via INTRAVENOUS

## 2024-10-20 MED ORDER — ONDANSETRON HCL 4 MG/2ML IJ SOLN
INTRAMUSCULAR | Status: DC | PRN
  Administered 2024-10-20: 4 mg via INTRAVENOUS

## 2024-10-20 MED ORDER — ONDANSETRON HCL 4 MG/2ML IJ SOLN: 4.0000 mg | Freq: Once | INTRAMUSCULAR | Status: DC | PRN

## 2024-10-20 SURGICAL SUPPLY — 25 items
"Bard Quicklink Cartridges with Brachysource I-125 " IMPLANT
BAG URINE DRAIN 2000ML AR STRL (UROLOGICAL SUPPLIES) ×2 IMPLANT
BLADE CLIPPER SURG (BLADE) ×2 IMPLANT
CATH ROBINSON RED A/P 20FR (CATHETERS) ×2 IMPLANT
COVER BACK TABLE 60X90IN (DRAPES) ×2 IMPLANT
COVER MAYO STAND STRL (DRAPES) ×2 IMPLANT
DRAPE SURG IRRIG POUCH 19X23 (DRAPES) ×2 IMPLANT
DRSG TEGADERM 8X12 (GAUZE/BANDAGES/DRESSINGS) ×2 IMPLANT
GLOVE SURG SS PI 8.0 STRL IVOR (GLOVE) ×2 IMPLANT
GOWN STRL REUS W/ TWL XL LVL3 (GOWN DISPOSABLE) ×2 IMPLANT
GOWN STRL SURGICAL XL XLNG (GOWN DISPOSABLE) ×2 IMPLANT
GRID BRACH TEMP 18GA 2.8X3X.75 (MISCELLANEOUS) ×2 IMPLANT
HOLDER FOLEY CATH W/STRAP (MISCELLANEOUS) ×2 IMPLANT
IMPL SPACEOAR SYSTEM 10ML (Spacer) ×2 IMPLANT
KIT TURNOVER KIT A (KITS) ×2 IMPLANT
MARKER SKIN DUAL TIP RULER LAB (MISCELLANEOUS) ×2 IMPLANT
NEEDLE BRACHY 18G 5PK (NEEDLE) ×8 IMPLANT
NEEDLE BRACHYTHERAPY 18GX20 (NEEDLE) IMPLANT
NEEDLE PK MORGANSTERN STABILIZ (NEEDLE) ×2 IMPLANT
PACK CYSTO (CUSTOM PROCEDURE TRAY) ×2 IMPLANT
PREP POVIDONE IODINE SPRAY 2OZ (MISCELLANEOUS) ×2 IMPLANT
SYR 10ML LL (SYRINGE) IMPLANT
TOWEL OR DSP ST BLU DLX 10/PK (DISPOSABLE) ×2 IMPLANT
TRAY FOLEY MTR SLVR 16FR STAT (SET/KITS/TRAYS/PACK) ×2 IMPLANT
UNDERPAD 30X36 HEAVY ABSORB (UNDERPADS AND DIAPERS) ×4 IMPLANT

## 2024-10-20 NOTE — Anesthesia Postprocedure Evaluation (Signed)
"   Anesthesia Post Note  Patient: Aaron Morales  Procedure(s) Performed: INSERTION, RADIATION SOURCE, PROSTATE (Prostate) INJECTION, HYDROGEL SPACER     Patient location during evaluation: PACU Anesthesia Type: General Level of consciousness: awake and alert Pain management: pain level controlled Vital Signs Assessment: post-procedure vital signs reviewed and stable Respiratory status: spontaneous breathing, nonlabored ventilation, respiratory function stable and patient connected to nasal cannula oxygen Cardiovascular status: blood pressure returned to baseline and stable Postop Assessment: no apparent nausea or vomiting Anesthetic complications: no   No notable events documented.  Last Vitals:  Vitals:   10/20/24 1000 10/20/24 1005  BP: 130/70 139/83  Pulse: 79 78  Resp: 18 15  Temp: 36.6 C 36.6 C  SpO2: 98% 95%    Last Pain:  Vitals:   10/20/24 1005  TempSrc: Oral  PainSc: 0-No pain                 Lynwood MARLA Cornea      "

## 2024-10-20 NOTE — Anesthesia Procedure Notes (Signed)
 Procedure Name: Intubation Date/Time: 10/20/2024 7:47 AM  Performed by: Belvie Valri NOVAK, CRNAPre-anesthesia Checklist: Patient identified, Emergency Drugs available, Suction available and Patient being monitored Patient Re-evaluated:Patient Re-evaluated prior to induction Oxygen Delivery Method: Circle System Utilized Preoxygenation: Pre-oxygenation with 100% oxygen Induction Type: IV induction Ventilation: Mask ventilation without difficulty and Oral airway inserted - appropriate to patient size Laryngoscope Size: Mac and 4 Grade View: Grade III Tube type: Oral Tube size: 7.5 mm Number of attempts: 1 Airway Equipment and Method: Stylet and Oral airway Placement Confirmation: ETT inserted through vocal cords under direct vision, positive ETCO2 and breath sounds checked- equal and bilateral Secured at: 23 cm Tube secured with: Tape Dental Injury: Teeth and Oropharynx as per pre-operative assessment

## 2024-10-20 NOTE — Progress Notes (Signed)
" °  Radiation Oncology         [phone] ________________________________  Name: Aaron Morales MRN: [id number]  Date: 10/20/2024  DOB: 03-27-1956       Prostate Seed Implant  RR:Gjrxdnw, Aaron PARAS, PA-C  No ref. provider found  DIAGNOSIS:  68 y.o. gentleman with Stage T1c adenocarcinoma of the prostate with Gleason score of 4+3, and PSA of 4.5.   Oncology History  Malignant neoplasm of prostate (HCC)  06/17/2024 Cancer Staging   Staging form: Prostate, AJCC 8th Edition - Clinical stage from 06/17/2024: Stage IIC (cT1c, cN0, cM0, PSA: 4.5, Grade Group: 3) - Signed by Sherwood Rise, PA-C on 07/07/2024 Histopathologic type: Adenocarcinoma, NOS Stage prefix: Initial diagnosis Prostate specific antigen (PSA) range: Less than 10 Gleason primary pattern: 4 Gleason secondary pattern: 3 Gleason score: 7 Histologic grading system: 5 grade system Number of biopsy cores examined: 15 Number of biopsy cores positive: 3 Location of positive needle core biopsies: Both sides   07/07/2024 Initial Diagnosis   Malignant neoplasm of prostate (HCC)       ICD-10-CM   1. Pre-op testing  Z01.818 Basic metabolic panel    Basic metabolic panel    2. Hypertension, unspecified type  I10 Basic metabolic panel    Basic metabolic panel      PROCEDURE: Insertion of radioactive I-125 seeds into the prostate gland.  RADIATION DOSE: 145 Gy, definitive therapy.  TECHNIQUE: Aaron Morales was brought to the operating room with the urologist. He was placed in the dorsolithotomy position. He was catheterized and a rectal tube was inserted. The perineum was shaved, prepped and draped. The ultrasound probe was then introduced by me into the rectum to see the prostate gland.  TREATMENT DEVICE: I attached the needle grid to the ultrasound probe stand and anchor needles were placed.  3D PLANNING: The prostate was imaged in 3D using a sagittal sweep of the prostate probe. These images were transferred to the  planning computer. There, the prostate, urethra and rectum were defined on each axial reconstructed image. Then, the software created an optimized 3D plan and a few seed positions were adjusted. The quality of the plan was reviewed using Aaron Morales information for the target and the following two organs at risk:  Urethra and Rectum.  Then the accepted plan was printed and handed off to the radiation therapist.  Under my supervision, the custom loading of the seeds and spacers was carried out using the quick loader.  These pre-loaded needles were then placed into the needle holder.Aaron Morales  PROSTATE VOLUME STUDY:  Using transrectal ultrasound the volume of the prostate was verified to be 53.9 cc.  SPECIAL TREATMENT PROCEDURE/SUPERVISION AND HANDLING: The pre-loaded needles were then delivered by the urologist under sagittal guidance. A total of 17 needles were used to deposit 63 seeds in the prostate gland. The individual seed activity was 0.562 mCi.  SpaceOAR:  Yes  COMPLEX SIMULATION: At the end of the procedure, an anterior radiograph of the pelvis was obtained to document seed positioning and count. Cystoscopy was performed by the urologist to check the urethra and bladder.  MICRODOSIMETRY: At the end of the procedure, the patient was emitting 0.07 mR/hr at 1 meter. Accordingly, he was considered safe for hospital discharge.  PLAN: The patient will return to the radiation oncology clinic for post implant CT dosimetry in three weeks.   ________________________________  Aaron Morales Aaron Morales, M.D.    "

## 2024-10-20 NOTE — Op Note (Signed)
 PATIENT:  Aaron Morales  PRE-OPERATIVE DIAGNOSIS:  Adenocarcinoma of the prostate  POST-OPERATIVE DIAGNOSIS:  Same  PROCEDURE:  Procedure(s): 1. I-125 radioactive seed implantation 2. SpaceOAR implantation. 3.  Cystoscopy  SURGEON:  Surgeon(s): Norleen Seltzer MD  Radiation oncologist: Dr. Donnice Barge  ANESTHESIA:  General  EBL:  Minimal  DRAINS: 16 French Foley catheter  INDICATION: Aaron Morales is a 68 y.o. with Stage T1c, Gleason 7(4+3) prostate cancer who has elected brachytherapy for treatment.  Description of procedure: After informed consent the patient was brought to the major OR, placed on the table and administered general anesthesia. He was then moved to the modified lithotomy position with his perineum perpendicular to the floor. His perineum and genitalia were then sterilely prepped. An official timeout was then performed. A 16 French Foley catheter was then placed in the bladder and filled with dilute contrast, a rectal tube was placed in the rectum and the transrectal ultrasound probe was placed in the rectum and affixed to the stand. He was then sterilely draped.  The sterile grid was installed.   Anchor needles were then placed.   Real time ultrasonography was used along with the seed planning software spot-pro version 3.1-00. This was used to develop the seed plan including the number of needles as well as number of seeds required for complete and adequate coverage. Real-time ultrasonography was then used along with the previously developed plan  to implant a total of 63 seeds using 17 needles for a target dose of 145 Gy. This proceeded without difficulty or complication.  The anchor needles and guide were removed and the SpaceOAR needle was passed under US  guidance into the fat stripe posterior to the prostate with the tip in the midline at mid prostate. A puff of NS confirmed appropriate positioning and the SpaceOAR polymer was then injected over 10 seconds into the  space with excellent distribution.     A Foley catheter was then removed as well as the transrectal ultrasound probe and rectal probe. Flexible cystoscopy was then performed using the 17 French flexible scope which revealed a normal urethra throughout its length down to the sphincter which appeared intact. The prostatic urethra was 2cm with lateral lobe hyperplasia with some intravesical protrusion. The bladder was then entered and fully and systematically inspected.  The ureteral orifices were noted to be of normal configuration and position. The mucosa revealed no evidence of tumors. There were also no stones identified within the bladder.  No seeds or spacers were seen and/or removed from the bladder.  The cystoscope was then removed.  The drapes were removed.  The perineum was cleaned and dressed.  He was taken out of the lithotomy position and was awakened and taken to recovery room in stable and satisfactory condition. He tolerated procedure well and there were no intraoperative complications.

## 2024-10-20 NOTE — Transfer of Care (Signed)
 Immediate Anesthesia Transfer of Care Note  Patient: Aaron Morales  Procedure(s) Performed: INSERTION, RADIATION SOURCE, PROSTATE (Prostate) INJECTION, HYDROGEL SPACER  Patient Location: PACU  Anesthesia Type:General  Level of Consciousness: awake  Airway & Oxygen Therapy: Patient Spontanous Breathing  Post-op Assessment: Report given to RN and Post -op Vital signs reviewed and stable  Post vital signs: Reviewed and stable  Last Vitals:  Vitals Value Taken Time  BP 157/115 10/20/24 09:09  Temp    Pulse    Resp 19 10/20/24 09:11  SpO2    Vitals shown include unfiled device data.  Last Pain:  Vitals:   10/20/24 0626  TempSrc:   PainSc: 0-No pain      Patients Stated Pain Goal: 5 (10/20/24 0626)  Complications: No notable events documented.

## 2024-10-20 NOTE — Interval H&P Note (Signed)
 History and Physical Interval Note:  10/20/2024 7:13 AM  Aaron Morales  has presented today for surgery, with the diagnosis of PROSTATE CANCER.  The various methods of treatment have been discussed with the patient and family. After consideration of risks, benefits and other options for treatment, the patient has consented to  Procedures: INSERTION, RADIATION SOURCE, PROSTATE (N/A) INJECTION, HYDROGEL SPACER (N/A) as a surgical intervention.  The patient's history has been reviewed, patient examined, no change in status, stable for surgery.  I have reviewed the patient's chart and labs.  Questions were answered to the patient's satisfaction.     Tristyn Demarest

## 2024-10-21 ENCOUNTER — Encounter (HOSPITAL_COMMUNITY): Payer: Self-pay | Admitting: Urology

## 2024-10-26 NOTE — Progress Notes (Signed)
 Patient was a RadOnc Consult on 07/07/24 for his stage T1c adenocarcinoma of the prostate with Gleason score of 4+3, and PSA of 4.5. Patient proceed with treatment recommendations of brachytherapy and had his treatment on 10/20/24.   Patient is scheduled for a CT Simulation and 11/11/24 and urology follow up on 11/10/24.   RN spoke with patient provided education on post treatment PSA monitoring.  All questions answered, no additional needs at this time.

## 2024-10-29 NOTE — Progress Notes (Signed)
 AUSAR GEORGIOU                                          MRN: [id number]   10/29/2024   The [facility] Quality Team Specialist reviewed this patient medical record for the purposes of chart review for care gap closure. The following were reviewed: chart review for care gap closure-controlling blood pressure.    [facility] Quality Team

## 2024-11-04 NOTE — Progress Notes (Signed)
 Post-seed nursing interview for a diagnosis of  69 y.o. gentleman with Stage T1c adenocarcinoma of the prostate with Gleason score of 4+3, and PSA of 4.5.   Patient identity verified x2.   Patient states issues as follows...  -Pain: No -Fatigue: Occasionally -Abdomen: No -Groin: No -Urinary: No -Bowels: No -Appetite: Normal    Patient denies all other related issues at this time.  Meaningful use complete.  I-PSS (AUA) score- 13 - Moderate SHIM (ED) score- 24 Urinary Management medication(s):  None Urology appointment date- Saw Sherlyn Moats, NP  with Dr. Norleen Seltzer on 11/10/24  Vitals- BP (!) 147/107 (Patient Position: Sitting)   Pulse 90   Temp 97.6 F (36.4 C) (Oral)   Resp 18   Wt 221 lb 3.2 oz (100.3 kg)   SpO2 97%   BMI 35.70 kg/m     This concludes the interaction.

## 2024-11-10 ENCOUNTER — Telehealth: Payer: Self-pay | Admitting: *Deleted

## 2024-11-10 NOTE — Telephone Encounter (Signed)
 CALLED PATIENT TO REMIND OF POST SEED APPTS. AND MRI FOR 11-11-24, LVM FOR A RETURN CALL

## 2024-11-11 ENCOUNTER — Ambulatory Visit (HOSPITAL_COMMUNITY)
Admission: RE | Admit: 2024-11-11 | Discharge: 2024-11-11 | Disposition: A | Discharge status: Home / Self Care | Source: Ambulatory Visit | Attending: Urology | Admitting: Urology

## 2024-11-11 ENCOUNTER — Ambulatory Visit
Admission: RE | Admit: 2024-11-11 | Discharge: 2024-11-11 | Disposition: A | Discharge status: Home / Self Care | Source: Ambulatory Visit | Attending: Radiation Oncology | Admitting: Radiation Oncology

## 2024-11-11 ENCOUNTER — Ambulatory Visit
Admission: RE | Admit: 2024-11-11 | Discharge: 2024-11-11 | Disposition: A | Discharge status: Home / Self Care | Source: Ambulatory Visit | Attending: Urology | Admitting: Urology

## 2024-11-11 VITALS — BP 147/107 | HR 90 | Temp 97.6°F | Resp 18 | Wt 221.2 lb

## 2024-11-11 DIAGNOSIS — C61 Malignant neoplasm of prostate: Secondary | ICD-10-CM

## 2024-11-11 NOTE — Progress Notes (Signed)
" °  Radiation Oncology         [phone] ________________________________  Name: Aaron Morales MRN: [id number]  Date: 11/11/2024  DOB: 1955/10/24  COMPLEX SIMULATION NOTE  NARRATIVE:  The patient was brought to the CT Simulation planning suite today following prostate seed implantation approximately one month ago.  Identity was confirmed.  All relevant records and images related to the planned course of therapy were reviewed.  Then, the patient was set-up supine.  CT images were obtained.  The CT images were loaded into the planning software.  Then the prostate and rectum were contoured.  Treatment planning then occurred.  The implanted iodine 125 seeds were identified by the physics staff for projection of radiation distribution  I have requested : 3D Simulation  I have requested a DVH of the following structures: Prostate and rectum.    ________________________________  Donnice FELIX Patrcia, M.D.  "

## 2024-11-11 NOTE — Progress Notes (Signed)
 " Radiation Oncology         [phone] ________________________________  Name: Aaron Morales MRN: [id number]  Date: 11/11/2024  DOB: 1955/12/05  Post-Seed Follow-Up Visit Note  CC: Aaron Morales  Diagnosis:   69 y.o. gentleman with Stage T1c adenocarcinoma of the prostate with Gleason score of 4+3, and PSA of 4.5.     ICD-10-CM   1. Malignant neoplasm of prostate (HCC)  C61       Interval Since Last Radiation:  3 weeks 10/20/24:  Insertion of radioactive I-125 seeds into the prostate gland; 145 Gy, definitive therapy with placement of SpaceOAR gel.  Narrative:  The patient returns today for routine follow-up.  He is complaining of increased urinary frequency and urinary hesitation symptoms. He filled out a questionnaire regarding urinary function today providing and overall IPSS score of 13 characterizing his symptoms as moderate with a weaker flow of stream, intermittency, frequency, urgency and hesitancy.  His pre-implant score was 5. He denies any abdominal pain or bowel symptoms but does still have some increased sensitivity in the perineum that is gradually improving. His energy level is good and overall, he is quite pleased with his progress to date.  ALLERGIES:  is allergic to molds & smuts and cat dander.  Meds: Current Outpatient Medications  Medication Sig Dispense Refill   HYDROcodone -acetaminophen  (NORCO/VICODIN) 5-325 MG tablet Take 1 tablet by mouth every 6 (six) hours as needed for severe pain (pain score 7-10). 8 tablet 0   LORazepam  (ATIVAN ) 1 MG tablet Take 1 tablet (1 mg total) by mouth as directed. Take one tablet 30 minutes prior to MRI and may repeat once before scan if needed 2 tablet 0   metoprolol (LOPRESSOR) 50 MG tablet Take 50 mg by mouth 2 (two) times daily.  3   Multiple Vitamin (MULTIVITAMIN PO) Take 1 tablet by mouth daily.     rivaroxaban  (XARELTO ) 20 MG TABS tablet Take 1 tablet (20 mg total) by mouth daily with  supper. To replace Pradaxa . 30 tablet 5   rosuvastatin  (CRESTOR ) 5 MG tablet Take 1 tablet (5 mg total) by mouth daily. 90 tablet 3   No current facility-administered medications for this encounter.    Physical Findings: In general this is a well appearing Caucasian male in no acute distress. He's alert and oriented x4 and appropriate throughout the examination. Cardiopulmonary assessment is negative for acute distress and he exhibits normal effort.   Lab Findings: Lab Results  Component Value Date   WBC 7.8 10/07/2024   HGB 16.3 10/07/2024   HCT 49.5 10/07/2024   MCV 90.7 10/07/2024   PLT 232 10/07/2024    Radiographic Findings:  Patient underwent CT imaging in our clinic for post implant dosimetry. The CT will be fused with his prostate MRI that will be performed at noon today and will be reviewed by Dr. Patrcia to confirm there is an adequate distribution of radioactive seeds throughout the prostate gland and ensure that there are no seeds in or near the rectum. We suspect the final radiation plan and dosimetry will show appropriate coverage of the prostate gland. He understands that we will call and inform him of any unexpected findings on further review of his imaging and dosimetry.  Impression/Plan: 69 y.o. gentleman with Stage T1c adenocarcinoma of the prostate with Gleason score of 4+3, and PSA of 4.5.  The patient is recovering from the effects of radiation. His urinary symptoms should gradually improve over the next  4-6 months. We talked about this today. He is encouraged by his improvement already and is otherwise pleased with his outcome. We also talked about long-term follow-up for prostate cancer following seed implant. He understands that ongoing PSA determinations and digital rectal exams will help perform surveillance to rule out disease recurrence. He had a follow up visit with Aaron Moats, NP on 11/10/24 and will have his initial post-treatment PSA and follow up with Aaron Morales in 01/2025. He understands what to expect with his PSA measures. Patient was also educated today about some of the long-term effects from radiation including a small risk for rectal bleeding and possibly erectile dysfunction. We talked about some of the general management approaches to these potential complications. However, I did encourage the patient to contact our office or return at any point if he has questions or concerns related to his previous radiation and prostate cancer.    Aaron MICAEL Rusk, PA-C  "

## 2024-11-20 ENCOUNTER — Encounter: Payer: Self-pay | Admitting: Radiation Oncology

## 2024-11-20 DIAGNOSIS — C61 Malignant neoplasm of prostate: Secondary | ICD-10-CM | POA: Diagnosis not present

## 2024-11-20 NOTE — Radiation Completion Notes (Signed)
 Patient Name: Aaron Morales, Aaron Morales MRN: [id number] Date of Birth: February 14, 1956 Referring Physician: NORLEEN SELTZER, M.D. Date of Service: 2024-11-20 Radiation Oncologist: Adina Barge, M.D. [facility]                             RADIATION ONCOLOGY END OF TREATMENT NOTE     Diagnosis: C61 Malignant neoplasm of prostate Staging on 2024-06-17: Malignant neoplasm of prostate (HCC) T=cT1c, N=cN0, M=cM0 Intent: Curative     ==========DELIVERED PLANS==========  Prostate Seed Implant Date: 2024-10-20   Plan Name: Prostate Seed Implant Site: Prostate Technique: Radioactive Seed Implant I-125 Mode: Brachytherapy Dose Per Fraction: 145 Gy Prescribed Dose (Delivered / Prescribed): 145 Gy / 145 Gy Prescribed Fxs (Delivered / Prescribed): 1 / 1     ==========ON TREATMENT VISIT DATES========== 2024-10-20     ==========UPCOMING VISITS==========

## 2025-08-25 ENCOUNTER — Ambulatory Visit: Admitting: Physician Assistant
